# Patient Record
Sex: Female | Born: 1935 | Hispanic: Yes | Marital: Single | Smoking: Never smoker
Health system: Southern US, Community
[De-identification: ages and names within clinical notes are randomized; demographics above are authoritative.]

## PROBLEM LIST (undated history)

## (undated) DIAGNOSIS — F410 Panic disorder [episodic paroxysmal anxiety] without agoraphobia: Secondary | ICD-10-CM

## (undated) DIAGNOSIS — F0390 Unspecified dementia without behavioral disturbance: Secondary | ICD-10-CM

## (undated) DIAGNOSIS — I10 Essential (primary) hypertension: Secondary | ICD-10-CM

## (undated) DIAGNOSIS — F419 Anxiety disorder, unspecified: Secondary | ICD-10-CM

## (undated) DIAGNOSIS — I639 Cerebral infarction, unspecified: Secondary | ICD-10-CM

## (undated) HISTORY — DX: Anxiety disorder, unspecified: F41.9

## (undated) HISTORY — PX: MANDIBLE FRACTURE SURGERY: SHX706

## (undated) HISTORY — DX: Panic disorder (episodic paroxysmal anxiety): F41.0

---

## 2011-08-15 HISTORY — PX: HIP SURGERY: SHX245

## 2018-01-02 DIAGNOSIS — F0390 Unspecified dementia without behavioral disturbance: Secondary | ICD-10-CM | POA: Insufficient documentation

## 2018-03-04 DIAGNOSIS — Z8739 Personal history of other diseases of the musculoskeletal system and connective tissue: Secondary | ICD-10-CM | POA: Insufficient documentation

## 2019-03-17 ENCOUNTER — Ambulatory Visit
Admission: RE | Admit: 2019-03-17 | Discharge: 2019-03-17 | Disposition: A | Discharge status: Home / Self Care | Payer: Medicare Other | Source: Ambulatory Visit | Attending: Family Medicine | Admitting: Family Medicine

## 2019-03-17 ENCOUNTER — Other Ambulatory Visit: Payer: Self-pay | Admitting: Family Medicine

## 2019-03-17 ENCOUNTER — Ambulatory Visit
Admission: RE | Admit: 2019-03-17 | Discharge: 2019-03-17 | Disposition: A | Discharge status: Home / Self Care | Payer: Medicare Other | Attending: Family Medicine | Admitting: Family Medicine

## 2019-03-17 ENCOUNTER — Other Ambulatory Visit: Payer: Self-pay

## 2019-03-17 ENCOUNTER — Ambulatory Visit: Admission: EM | Admit: 2019-03-17 | Discharge: 2019-03-17 | Discharge status: Absent without leave | Payer: Self-pay

## 2019-03-17 ENCOUNTER — Ambulatory Visit: Admission: RE | Admit: 2019-03-17 | Payer: Medicare Other | Source: Ambulatory Visit

## 2019-03-17 DIAGNOSIS — M25561 Pain in right knee: Secondary | ICD-10-CM | POA: Insufficient documentation

## 2019-03-17 DIAGNOSIS — G8929 Other chronic pain: Secondary | ICD-10-CM

## 2021-11-15 ENCOUNTER — Emergency Department: Payer: Medicare Other

## 2021-11-15 ENCOUNTER — Emergency Department
Admission: EM | Admit: 2021-11-15 | Discharge: 2021-11-15 | Disposition: A | Discharge status: Home / Self Care | Payer: Medicare Other | Attending: Emergency Medicine | Admitting: Emergency Medicine

## 2021-11-15 ENCOUNTER — Other Ambulatory Visit: Payer: Self-pay

## 2021-11-15 ENCOUNTER — Encounter: Payer: Self-pay | Admitting: Radiology

## 2021-11-15 DIAGNOSIS — I10 Essential (primary) hypertension: Secondary | ICD-10-CM | POA: Diagnosis not present

## 2021-11-15 DIAGNOSIS — R350 Frequency of micturition: Secondary | ICD-10-CM | POA: Insufficient documentation

## 2021-11-15 DIAGNOSIS — Z20822 Contact with and (suspected) exposure to covid-19: Secondary | ICD-10-CM | POA: Diagnosis not present

## 2021-11-15 DIAGNOSIS — R0602 Shortness of breath: Secondary | ICD-10-CM

## 2021-11-15 DIAGNOSIS — F0390 Unspecified dementia without behavioral disturbance: Secondary | ICD-10-CM | POA: Insufficient documentation

## 2021-11-15 DIAGNOSIS — F419 Anxiety disorder, unspecified: Secondary | ICD-10-CM | POA: Diagnosis not present

## 2021-11-15 HISTORY — DX: Cerebral infarction, unspecified: I63.9

## 2021-11-15 HISTORY — DX: Essential (primary) hypertension: I10

## 2021-11-15 HISTORY — DX: Unspecified dementia, unspecified severity, without behavioral disturbance, psychotic disturbance, mood disturbance, and anxiety: F03.90

## 2021-11-15 LAB — CBC WITH DIFFERENTIAL/PLATELET
Abs Immature Granulocytes: 0.01 10*3/uL (ref 0.00–0.07)
Basophils Absolute: 0 10*3/uL (ref 0.0–0.1)
Basophils Relative: 0 %
Eosinophils Absolute: 0.3 10*3/uL (ref 0.0–0.5)
Eosinophils Relative: 4 %
HCT: 38.4 % (ref 36.0–46.0)
Hemoglobin: 12.6 g/dL (ref 12.0–15.0)
Immature Granulocytes: 0 %
Lymphocytes Relative: 16 %
Lymphs Abs: 1.2 10*3/uL (ref 0.7–4.0)
MCH: 29.4 pg (ref 26.0–34.0)
MCHC: 32.8 g/dL (ref 30.0–36.0)
MCV: 89.7 fL (ref 80.0–100.0)
Monocytes Absolute: 0.6 10*3/uL (ref 0.1–1.0)
Monocytes Relative: 8 %
Neutro Abs: 5.4 10*3/uL (ref 1.7–7.7)
Neutrophils Relative %: 72 %
Platelets: 225 10*3/uL (ref 150–400)
RBC: 4.28 MIL/uL (ref 3.87–5.11)
RDW: 13.3 % (ref 11.5–15.5)
WBC: 7.5 10*3/uL (ref 4.0–10.5)
nRBC: 0 % (ref 0.0–0.2)

## 2021-11-15 LAB — COMPREHENSIVE METABOLIC PANEL
ALT: 14 U/L (ref 0–44)
AST: 23 U/L (ref 15–41)
Albumin: 4.2 g/dL (ref 3.5–5.0)
Alkaline Phosphatase: 81 U/L (ref 38–126)
Anion gap: 14 (ref 5–15)
BUN: 32 mg/dL — ABNORMAL HIGH (ref 8–23)
CO2: 25 mmol/L (ref 22–32)
Calcium: 9.5 mg/dL (ref 8.9–10.3)
Chloride: 103 mmol/L (ref 98–111)
Creatinine, Ser: 0.78 mg/dL (ref 0.44–1.00)
GFR, Estimated: >60 mL/min (ref >60)
Glucose, Bld: 116 mg/dL — ABNORMAL HIGH (ref 70–99)
Potassium: 4 mmol/L (ref 3.5–5.1)
Sodium: 142 mmol/L (ref 135–145)
Total Bilirubin: 0.7 mg/dL (ref 0.3–1.2)
Total Protein: 7.3 g/dL (ref 6.5–8.1)

## 2021-11-15 LAB — URINALYSIS, ROUTINE W REFLEX MICROSCOPIC
Bilirubin Urine: NEGATIVE
Glucose, UA: NEGATIVE mg/dL
Hgb urine dipstick: NEGATIVE
Ketones, ur: NEGATIVE mg/dL
Leukocytes,Ua: NEGATIVE
Nitrite: NEGATIVE
Protein, ur: NEGATIVE mg/dL
Specific Gravity, Urine: 1.01 (ref 1.005–1.030)
pH: 9 — ABNORMAL HIGH (ref 5.0–8.0)

## 2021-11-15 LAB — BRAIN NATRIURETIC PEPTIDE: B Natriuretic Peptide: 71 pg/mL (ref 0.0–100.0)

## 2021-11-15 LAB — TROPONIN I (HIGH SENSITIVITY)
Troponin I (High Sensitivity): 7 ng/L (ref <18)
Troponin I (High Sensitivity): 8 ng/L (ref <18)

## 2021-11-15 LAB — RESP PANEL BY RT-PCR (FLU A&B, COVID) ARPGX2
Influenza A by PCR: NEGATIVE
Influenza B by PCR: NEGATIVE
SARS Coronavirus 2 by RT PCR: NEGATIVE

## 2021-11-15 MED ORDER — IOHEXOL 350 MG/ML SOLN
75.0000 mL | Freq: Once | INTRAVENOUS | Status: AC | PRN
  Administered 2021-11-15: 75 mL via INTRAVENOUS

## 2021-11-15 MED ORDER — LORAZEPAM 2 MG/ML IJ SOLN: 0.5000 mg | Freq: Once | INTRAMUSCULAR | Status: DC

## 2021-11-15 NOTE — ED Triage Notes (Signed)
Pt sister states she has been breathing this wasy for the past few days. Pt has a history of dementia but was started on Mirtazapine and thinks that it is causing her increased trouble breathing and anxiety. ?

## 2021-11-15 NOTE — ED Notes (Addendum)
Pt's daughter verbalized at bedside wants to go home, they don't want to wait for psychiatry consult.  wants to follow up w/ PCP tomorrow . ERMD made aware . ERMD at bedside and spoke to pt and family . Primary RN made aware  ?

## 2021-11-15 NOTE — Consult Note (Signed)
[facility] Face-to-Face Psychiatry Consult  ? ?Reason for Consult: Shortness of Breath  ?Referring Physician: Dr. Fuller Plan ?Patient Identification: Diana Acosta ?MRN:  [id number] ?Principal Diagnosis: <principal problem not specified> ?Diagnosis:  Active Problems: ?  Dementia (HCC) ?  Anxiety ? ? ?Total Time spent with patient: 1 hour ? ?Subjective:   ?Diana Acosta is a 88 y.o. female patient presented to [facility] ED via POV with her sister, who is her caretaker. The patient's sister shared that her sister has been breathing fast for the past few days. The patient has a history of dementia but was started on Mirtazapine and thought it was causing her to experience increased respiration and anxiety. ?This provider saw the patient face-to-face; the chart was reviewed, and consulted with Dr. Fuller Plan on 11/15/2021 due to the patient's care. It was discussed with the EDP that the patient does not meet the criteria to be admitted to the geriatric psychiatric inpatient unit.  ?On evaluation, the patient is alert and oriented x2, anxious, cooperative, and mood-congruent with affect. The patient does appear to be responding to internal and external stimuli. Neither is the patient presenting with any delusional thinking. The patient denies auditory or visual hallucinations. The patient denies any suicidal, homicidal, or self-harm ideations. The patient is not presenting with any psychotic or paranoid behaviors.  ? ?HPI: Per Dr. Fuller Plan, Diana Reamer Judye Lorino is a 88 y.o. female with stroke, dementia, HTN who comes in with SOB.  ?Couple of months saw a psychiatric nurse that gave her mirtazapine for sleep which has been helping sleep.  Since starting it she has some SOB which they report is a side effect of the medication.  There are periods were she is breathing regularly but then she has periods.  However today they noted that she seemed to be breathing harder than normally so they brought her into the emergency room to be  evaluated.  They also do report some increased urinary frequency.  No falls or hitting her head ? ?Past Psychiatric History:  ?Dementia (HCC) ?Hypertension ?Stroke [facility]) ? ?Risk to Self:   ?Risk to Others:   ?Prior Inpatient Therapy:   ?Prior Outpatient Therapy:   ? ?Past Medical History:  ?Past Medical History:  ?Diagnosis Date  ? Dementia (HCC)   ? Hypertension   ? Stroke [facility])   ?  ?Past Surgical History:  ?Procedure Laterality Date  ? MANDIBLE FRACTURE SURGERY    ? ?Family History: History reviewed. No pertinent family history. ?Family Psychiatric  History:  ?Social History:  ?Social History  ? ?Substance and Sexual Activity  ?Alcohol Use Never  ?   ?Social History  ? ?Substance and Sexual Activity  ?Drug Use Never  ?  ?Social History  ? ?Socioeconomic History  ? Marital status: Single  ?  Spouse name: Not on file  ? Number of children: Not on file  ? Years of education: Not on file  ? Highest education level: Not on file  ?Occupational History  ? Not on file  ?Tobacco Use  ? Smoking status: Never  ? Smokeless tobacco: Never  ?Vaping Use  ? Vaping Use: Never used  ?Substance and Sexual Activity  ? Alcohol use: Never  ? Drug use: Never  ? Sexual activity: Not Currently  ?Other Topics Concern  ? Not on file  ?Social History Narrative  ? Not on file  ? ?Social Determinants of Health  ? ?Financial Resource Strain: Not on file  ?Food Insecurity: Not on file  ?  Transportation Needs: Not on file  ?Physical Activity: Not on file  ?Stress: Not on file  ?Social Connections: Not on file  ? ?Additional Social History: ?  ? ?Allergies:  No Known Allergies ? ?Labs:  ?Results for orders placed or performed during the hospital encounter of 11/15/21 (from the past 48 hour(s))  ?CBC with Differential     Status: None  ? Collection Time: 11/15/21  5:45 PM  ?Result Value Ref Range  ? WBC 7.5 4.0 - 10.5 K/uL  ? RBC 4.28 3.87 - 5.11 MIL/uL  ? Hemoglobin 12.6 12.0 - 15.0 g/dL  ? HCT 38.4 36.0 - 46.0 %  ? MCV 89.7 80.0 - 100.0 fL  ? MCH  29.4 26.0 - 34.0 pg  ? MCHC 32.8 30.0 - 36.0 g/dL  ? RDW 13.3 11.5 - 15.5 %  ? Platelets 225 150 - 400 K/uL  ? nRBC 0.0 0.0 - 0.2 %  ? Neutrophils Relative % 72 %  ? Neutro Abs 5.4 1.7 - 7.7 K/uL  ? Lymphocytes Relative 16 %  ? Lymphs Abs 1.2 0.7 - 4.0 K/uL  ? Monocytes Relative 8 %  ? Monocytes Absolute 0.6 0.1 - 1.0 K/uL  ? Eosinophils Relative 4 %  ? Eosinophils Absolute 0.3 0.0 - 0.5 K/uL  ? Basophils Relative 0 %  ? Basophils Absolute 0.0 0.0 - 0.1 K/uL  ? Immature Granulocytes 0 %  ? Abs Immature Granulocytes 0.01 0.00 - 0.07 K/uL  ?  Comment: Performed at [facility], [address], [place], [place] [address]  ?Comprehensive metabolic panel     Status: Abnormal  ? Collection Time: 11/15/21  5:45 PM  ?Result Value Ref Range  ? Sodium 142 135 - 145 mmol/L  ? Potassium 4.0 3.5 - 5.1 mmol/L  ? Chloride 103 98 - 111 mmol/L  ? CO2 25 22 - 32 mmol/L  ? Glucose, Bld 116 (H) 70 - 99 mg/dL  ?  Comment: Glucose reference range applies only to samples taken after fasting for at least 8 hours.  ? BUN 32 (H) 8 - 23 mg/dL  ? Creatinine, Ser 0.78 0.44 - 1.00 mg/dL  ? Calcium 9.5 8.9 - 10.3 mg/dL  ? Total Protein 7.3 6.5 - 8.1 g/dL  ? Albumin 4.2 3.5 - 5.0 g/dL  ? AST 23 15 - 41 U/L  ? ALT 14 0 - 44 U/L  ? Alkaline Phosphatase 81 38 - 126 U/L  ? Total Bilirubin 0.7 0.3 - 1.2 mg/dL  ? GFR, Estimated >60 >60 mL/min  ?  Comment: (NOTE) ?Calculated using the CKD-EPI Creatinine Equation (2021) ?  ? Anion gap 14 5 - 15  ?  Comment: Performed at [facility], [address] [place], [place] [address]  ?Troponin I (High Sensitivity)     Status: None  ? Collection Time: 11/15/21  5:45 PM  ?Result Value Ref Range  ? Troponin I (High Sensitivity) 7 <18 ng/L  ?  Comment: (NOTE) ?Elevated high sensitivity troponin I (hsTnI) values and significant  ?changes across serial measurements may suggest ACS but many other  ?chronic and acute conditions are known to elevate hsTnI results.  ?Refer to the "Links" section for  chest pain algorithms and additional  ?guidance. ?Performed at [facility], [address] [place], ?[place] [address] ?  ?Brain natriuretic peptide     Status: None  ? Collection Time: 11/15/21  5:45 PM  ?Result Value Ref Range  ? B Natriuretic Peptide 71.0 0.0 -  100.0 pg/mL  ?  Comment: Performed at [facility], [address] [place], [place] [address]  ?Urinalysis, Routine w reflex microscopic Urine, Clean Catch     Status: Abnormal  ? Collection Time: 11/15/21  7:05 PM  ?Result Value Ref Range  ? Color, Urine STRAW (A) YELLOW  ? APPearance CLEAR (A) CLEAR  ? Specific Gravity, Urine 1.010 1.005 - 1.030  ? pH 9.0 (H) 5.0 - 8.0  ? Glucose, UA NEGATIVE NEGATIVE mg/dL  ? Hgb urine dipstick NEGATIVE NEGATIVE  ? Bilirubin Urine NEGATIVE NEGATIVE  ? Ketones, ur NEGATIVE NEGATIVE mg/dL  ? Protein, ur NEGATIVE NEGATIVE mg/dL  ? Nitrite NEGATIVE NEGATIVE  ? Leukocytes,Ua NEGATIVE NEGATIVE  ?  Comment: Performed at [facility], [address] [place], [place] [address]  ?Troponin I (High Sensitivity)     Status: None  ? Collection Time: 11/15/21  8:09 PM  ?Result Value Ref Range  ? Troponin I (High Sensitivity) 8 <18 ng/L  ?  Comment: (NOTE) ?Elevated high sensitivity troponin I (hsTnI) values and significant  ?changes across serial measurements may suggest ACS but many other  ?chronic and acute conditions are known to elevate hsTnI results.  ?Refer to the "Links" section for chest pain algorithms and additional  ?guidance. ?Performed at [facility], [address] [place], ?[place] [address] ?  ? ? ?No current facility-administered medications for this encounter.  ? ?No current outpatient medications on file.  ? ? ?Musculoskeletal: ?Strength & Muscle Tone: decreased ?Gait & Station: unsteady ?Patient leans: Backward ? ?Psychiatric Specialty Exam: ? ?Presentation  ?General Appearance: Appropriate for Environment ? ?Eye Contact:Good ? ?Speech:Garbled ? ?Speech  Volume:Decreased ? ?Handedness:Right ? ? ?Mood and Affect  ?Mood:Anxious ? ?Affect:Appropriate ? ? ?Thought Process  ?Thought Processes:Coherent ? ?Descriptions of Associations:Circumstantial ? ?Orientation:Partial ?

## 2021-11-15 NOTE — ED Provider Notes (Signed)
? ?[facility] ?Provider Note ? ? ? Event Date/Time  ? First MD Initiated Contact with Patient 11/15/21 1740   ?  (approximate) ? ? ?History  ? ?Shortness of Breath ? ? ?HPI ? ?Diana Acosta is a 88 y.o. female with stroke, dementia, HTN who comes in with SOB.  ? ?Couple of months saw a psychiatric nurse that gave her mirtazapine for sleep which has been helping sleep.  Since starting it she has some SOB which they report is a side effect of the medication.  There are periods were she is breathing regularly but then she has periods.  However today they noted that she seemed to be breathing harder than normally so they brought her into the emergency room to be evaluated.  They also do report some increased urinary frequency.  No falls or hitting her head ? ? ?Physical Exam  ? ?Triage Vital Signs: ?ED Triage Vitals  ?Enc Vitals Group  ?   BP 11/15/21 1738 (!) 159/131  ?   Pulse --   ?   Resp 11/15/21 1738 (!) 24  ?   Temp 11/15/21 1738 98.2 ?F (36.8 ?C)  ?   Temp Source 11/15/21 1738 Axillary  ?   SpO2 11/15/21 1743 97 %  ?   Weight 11/15/21 1735 109 lb (49.4 kg)  ?   Height 11/15/21 1735 5' (1.524 m)  ?   Head Circumference --   ?   Peak Flow --   ?   Pain Score --   ?   Pain Loc --   ?   Pain Edu? --   ?   Excl. in GC? --   ? ? ?Most recent vital signs: ?Vitals:  ? 11/15/21 1738 11/15/21 1743  ?BP: (!) 159/131   ?Resp: (!) 24   ?Temp: 98.2 ?F (36.8 ?C)   ?SpO2:  97%  ? ? ? ?General: Awake, no distress.  ?CV:  Good peripheral perfusion.  ?Resp:  Increased work of breathing with clear lungs ?Abd:  No distention.  ?Other:  No swelling noted ? ? ?ED Results / Procedures / Treatments  ? ?Labs ?(all labs ordered are listed, but only abnormal results are displayed) ?Labs Reviewed  ?URINE CULTURE  ?CBC WITH DIFFERENTIAL/PLATELET  ?COMPREHENSIVE METABOLIC PANEL  ?URINALYSIS, ROUTINE W REFLEX MICROSCOPIC  ?BRAIN NATRIURETIC PEPTIDE  ?TROPONIN I (HIGH SENSITIVITY)  ? ? ? ?EKG ? ?My interpretation  of EKG: ? ?Sinus tachycardia rate of 111, no ST elevations, no T wave inversions, normal intervals.  There is a lot of artifact the patient is moving around a lot ? ?RADIOLOGY ?I have reviewed the xray personally and no findings of pneumonia or pleural effusion ? ?CT pending  ? ? ?PROCEDURES: ? ?Critical Care performed: No ? ?.1-3 Lead EKG Interpretation ?Performed by: Concha Se, MD ?Authorized by: Concha Se, MD  ? ?  Interpretation: abnormal   ?  ECG rate:  110 ?  ECG rate assessment: tachycardic   ?  Rhythm: sinus tachycardia   ?  Ectopy: none   ?  Conduction: normal   ? ? ?MEDICATIONS ORDERED IN ED: ?Medications  ?iohexol (OMNIPAQUE) 350 MG/ML injection 75 mL (75 mLs Intravenous Contrast Given 11/15/21 1906)  ? ? ? ?IMPRESSION / MDM / ASSESSMENT AND PLAN / ED COURSE  ?I reviewed the triage vital signs and the nursing notes. ?             ?               ? ?  Patient comes in acutely short of breath with some increased work of breathing but her oxygen levels are actually normal at 97% and she has overall clear lungs.  There was concern from the family that this may be more anxiety.  Offered to get translator but family declined.  We will get labs to evaluate for ACS, fluid overload.  We will get chest x-ray to evaluate for any pneumonia, pneumothorax.  With family's help patient was able to calm down and upon repeat evaluation she is breathing a little bit more comfortably.\\ ? ? ?CBC normal ?CMP reassuring ?Troponin negative ?BNP negative ? ?Given patient's tachycardia with shortness of breath and elderly female will get CT scan to make sure evidence of pulmonary embolism.  If this is negative then I suspect that it could be related to her anxiety  ? ?Repeat troponin is negative ? ?CT scans were ordered I also add on a head and neck just given they have reported may be a fall in case get anticoagulate. ? ?CT scan shows old occipital infarct.  Provided a copy of result ? ?Discussed with patient's daughter  about her anxiety most likely contributing to some of her shortness of breath.  Patient is very calm and breathing normally at this time.  They requesting psychiatry consultation.  Psychiatry saw patient and cleared them and they are requesting discharge home.  Family is, asking if they can go home they want to follow-up with her outpatient psychiatrist.  Given this will discharge patient.  COVID test was also ordered and they are leaving prior to it resulting. ? ? ? ?FINAL CLINICAL IMPRESSION(S) / ED DIAGNOSES  ? ?Final diagnoses:  ?Anxiety  ?SOB (shortness of breath)  ? ? ? ?Rx / DC Orders  ? ?ED Discharge Orders   ? ? None  ? ?  ? ? ? ?Note:  This document was prepared using Dragon voice recognition software and may include unintentional dictation errors. ?  ?Concha Se, MD ?11/15/21 2232 ? ?

## 2021-11-15 NOTE — Discharge Instructions (Addendum)
There was no evidence of heart attack or blood clots and you can follow-up with your primary doctor or your psychiatrist to discuss her anxiety. ? ?Return to the ER if develops worsening shortness of breath or any other concerns.  Her COVID test is pending to follow-up in MyChart for the ? ?IMPRESSION: ?Atrophy, chronic microvascular disease. ?  ?No acute intracranial abnormality. ?  ?Old right occipital infarct. ?  ?

## 2021-11-15 NOTE — ED Notes (Signed)
Upon my arrival on shift, pt in CT ?

## 2021-11-17 LAB — URINE CULTURE

## 2022-06-20 DIAGNOSIS — F209 Schizophrenia, unspecified: Secondary | ICD-10-CM | POA: Insufficient documentation

## 2023-05-09 ENCOUNTER — Encounter: Payer: Self-pay | Admitting: Pediatrics

## 2023-05-09 ENCOUNTER — Ambulatory Visit: Payer: Medicare Other | Admitting: Pediatrics

## 2023-05-09 VITALS — Wt 84.2 lb

## 2023-05-09 DIAGNOSIS — Z8673 Personal history of transient ischemic attack (TIA), and cerebral infarction without residual deficits: Secondary | ICD-10-CM

## 2023-05-09 DIAGNOSIS — F0390 Unspecified dementia without behavioral disturbance: Secondary | ICD-10-CM | POA: Diagnosis not present

## 2023-05-09 DIAGNOSIS — Z681 Body mass index (BMI) 19 or less, adult: Secondary | ICD-10-CM | POA: Insufficient documentation

## 2023-05-09 DIAGNOSIS — I10 Essential (primary) hypertension: Secondary | ICD-10-CM | POA: Insufficient documentation

## 2023-05-09 DIAGNOSIS — E46 Unspecified protein-calorie malnutrition: Secondary | ICD-10-CM | POA: Diagnosis not present

## 2023-05-09 DIAGNOSIS — Z7689 Persons encountering health services in other specified circumstances: Secondary | ICD-10-CM

## 2023-05-09 DIAGNOSIS — F202 Catatonic schizophrenia: Secondary | ICD-10-CM | POA: Diagnosis not present

## 2023-05-09 DIAGNOSIS — F203 Undifferentiated schizophrenia: Secondary | ICD-10-CM

## 2023-05-09 DIAGNOSIS — R54 Age-related physical debility: Secondary | ICD-10-CM | POA: Insufficient documentation

## 2023-05-09 MED ORDER — RISPERIDONE 0.25 MG PO TABS: 0.2500 mg | ORAL_TABLET | Freq: Every day | ORAL | 11 refills | Status: DC

## 2023-05-09 MED ORDER — MIRTAZAPINE 7.5 MG PO TABS
7.5000 mg | ORAL_TABLET | Freq: Every day | ORAL | 3 refills | Status: DC
Start: 2023-05-09 — End: 2023-05-18

## 2023-05-09 MED ORDER — SERTRALINE HCL 25 MG PO TABS
12.5000 mg | ORAL_TABLET | Freq: Every day | ORAL | 11 refills | Status: DC
Start: 2023-05-09 — End: 2023-05-18

## 2023-05-09 NOTE — Assessment & Plan Note (Addendum)
Progressively declining. Treatment for behavioral disturbances as noted under schizophrenia problem.

## 2023-05-09 NOTE — Assessment & Plan Note (Signed)
Start protein supplements. Consider nutrition referral at follow up.

## 2023-05-09 NOTE — Assessment & Plan Note (Addendum)
On chart review, unclear when she was diagnosed with this. Unable to find psychiatry notes. She has been diagnosed w dementia w behavioral disturbance which suspect may have contributed to prior episodes of psychosis. She is currently on risperidone 0.25mg  as needed and sertraline 12.5mg . Pt's sister, who is primary caregiver, noted she seemed less anxious and more at ease while or mirtazapine. Plan to restart this and continue the sertraline. Continue risperidone at current dose, consider rexulti vs dose adjustments for behavioral disturbances.

## 2023-05-09 NOTE — Assessment & Plan Note (Signed)
Noted today. High risk for falls. She does not do well with home aids. Will work on nutrition and education around fall risk for family/caregivers.

## 2023-05-09 NOTE — Progress Notes (Signed)
Establish Care Note  Wt 84 lb 3.2 oz (38.2 kg)   BMI 16.44 kg/m    Subjective:    Patient ID: Diana Acosta, female    DOB: 1929-03-18, 89 y.o.   MRN: [id number]  HPI: Diana Acosta is a 89 y.o. female  Chief Complaint  Patient presents with   Establish Care    Unable to obtain vitals due to patient unable remain still, only weight   Due to language barrier, spanish speaking provider was present during the history-taking and subsequent discussion (and for part of the physical exam) with this patient.   Niece and sister at bedside help provide history.  Behavior Disturbance Pt w h/o schizophrenia and dementia She recently had some medication changes. Discontinued mirtazapine due to abnormal movement/inability to sit still which sister though may have been from medication. Was started on sertraline and has not worked to help her be more calm Family interested in going back on mirtazapine  Weight loss Thought to be secondary to inability to sit still, activity>caloric intake Thickening food, using Whey protein All meals pureed No dentures  Relevant past medical, surgical, family and social history reviewed and updated as indicated. Interim medical history since our last visit reviewed. Allergies and medications reviewed and updated.  ROS per HPI unless specifically indicated above     Objective:    Wt 84 lb 3.2 oz (38.2 kg)   BMI 16.44 kg/m   Wt Readings from Last 3 Encounters:  05/09/23 84 lb 3.2 oz (38.2 kg)  11/15/21 109 lb (49.4 kg)     Physical Exam Constitutional:      Appearance: Normal appearance.     Comments: Elderly, frail, thin-appearing  HENT:     Head: Normocephalic and atraumatic.  Eyes:     Pupils: Pupils are equal, round, and reactive to light.  Cardiovascular:     Rate and Rhythm: Normal rate and regular rhythm.     Pulses: Normal pulses.     Heart sounds: Normal heart sounds.  Pulmonary:     Effort: Pulmonary effort is  normal.     Breath sounds: Normal breath sounds.  Abdominal:     General: Abdomen is flat.     Palpations: Abdomen is soft.  Musculoskeletal:        General: Normal range of motion.     Cervical back: Normal range of motion.  Skin:    General: Skin is warm and dry.     Capillary Refill: Capillary refill takes less than 2 seconds.  Neurological:     Mental Status: She is alert. Mental status is at baseline.     Comments: Unable to do full exam due to akathisia No focal deficits noted Shuffled gait  Psychiatric:     Comments: Patient nonverbal, notably had orofacial dyskinesia as well as akthisia (unable to sit during entire visit)        Assessment & Plan:  Assessment & Plan   Dementia, unspecified dementia severity, unspecified dementia type, unspecified whether behavioral, psychotic, or mood disturbance or anxiety (HCC) Assessment & Plan: Progressively declining. Treatment for behavioral disturbances as noted under schizophrenia problem.   Orders: -     Mirtazapine; Take 1 tablet (7.5 mg total) by mouth at bedtime.  Dispense: 90 tablet; Refill: 3 -     risperiDONE; Take 1 tablet (0.25 mg total) by mouth daily.  Dispense: 30 tablet; Refill: 11 -     Sertraline HCl; Take 0.5 tablets (12.5 mg total)  by mouth daily.  Dispense: 15 tablet; Refill: 11  Schizophrenia, catatonic (HCC) Assessment & Plan: On chart review, unclear when she was diagnosed with this. Unable to find psychiatry notes. She has been diagnosed w dementia w behavioral disturbance which suspect may have contributed to prior episodes of psychosis. She is currently on risperidone 0.25mg  as needed and sertraline 12.5mg . Pt's sister, who is primary caregiver, noted she seemed less anxious and more at ease while or mirtazapine. Plan to restart this and continue the sertraline. Continue risperidone at current dose, consider rexulti vs dose adjustments for behavioral disturbances.   Orders: -     Mirtazapine; Take 1 tablet  (7.5 mg total) by mouth at bedtime.  Dispense: 90 tablet; Refill: 3 -     risperiDONE; Take 1 tablet (0.25 mg total) by mouth daily.  Dispense: 30 tablet; Refill: 11 -     Sertraline HCl; Take 0.5 tablets (12.5 mg total) by mouth daily.  Dispense: 15 tablet; Refill: 11  Senile dementia (HCC) Assessment & Plan: Progressively declining. Treatment for behavioral disturbances as noted under schizophrenia problem.    Undifferentiated schizophrenia (HCC) Assessment & Plan: On chart review, unclear when she was diagnosed with this. Unable to find psychiatry notes. She has been diagnosed w dementia w behavioral disturbance which suspect may have contributed to prior episodes of psychosis. She is currently on risperidone 0.25mg  as needed and sertraline 12.5mg . Pt's sister, who is primary caregiver, noted she seemed less anxious and more at ease while or mirtazapine. Plan to restart this and continue the sertraline. Continue risperidone at current dose, consider rexulti vs dose adjustments for behavioral disturbances.    Protein-calorie malnutrition, unspecified severity (HCC) Assessment & Plan: Patient with ongoing weight loss presumably because she is moving/burning so many calories during the day. Discussed adding protein shake supplements at least daily to start. Family purees all food and they report decent appetite. Consider nutrition referral for further support at follow up.   Frailty Assessment & Plan: Noted today. High risk for falls. She does not do well with home aids. Will work on nutrition and education around fall risk for family/caregivers.   H/O: CVA (cerebrovascular accident) Assessment & Plan: Previously on plavix no longer taking. Continue to monitor.   Body mass index (BMI) less than 16.5 Assessment & Plan: Start protein supplements. Consider nutrition referral at follow up.   Encounter to establish care Reviewed patient record including history, medications, problem  list. Will schedule for virtual follow up to continue adjusting medications.   Follow up plan: Return in about 1 week (around 05/16/2023) for (virtual), Mood.  Yanely Mast Howell Pringle, MD

## 2023-05-09 NOTE — Assessment & Plan Note (Signed)
Patient with ongoing weight loss presumably because she is moving/burning so many calories during the day. Discussed adding protein shake supplements at least daily to start. Family purees all food and they report decent appetite. Consider nutrition referral for further support at follow up.

## 2023-05-09 NOTE — Patient Instructions (Signed)
[id number] a [employer]!  Como su mdico de atencin primaria, espero trabajar con usted para ayudarle a Barista sus objetivos de [employer].  Tenga en cuenta un par de elementos logsticos: - Si me enva un mensaje a Clinical cytogeneticist, es posible que tarde [place] 1 y 2 das hbiles en responderle. Esto es para mensajes no urgentes.  - Si necesita atencin clnica urgente, llame a la clnica o presntese en la sala de urgencias/emergencias. - Si tiene laboratorios, Recruitment consultant un mensaje sobre ellos en 1 o 2 das hbiles. - No estoy aqu los lunes; de lo contrario, estar disponible de martes a viernes de 8 a.m. a 5 p.m.

## 2023-05-09 NOTE — Assessment & Plan Note (Signed)
Previously on plavix no longer taking. Continue to monitor.

## 2023-05-18 ENCOUNTER — Telehealth (INDEPENDENT_AMBULATORY_CARE_PROVIDER_SITE_OTHER): Payer: Medicare Other | Admitting: Pediatrics

## 2023-05-18 ENCOUNTER — Encounter: Payer: Self-pay | Admitting: Pediatrics

## 2023-05-18 VITALS — Temp 97.6°F

## 2023-05-18 DIAGNOSIS — F203 Undifferentiated schizophrenia: Secondary | ICD-10-CM | POA: Diagnosis not present

## 2023-05-18 DIAGNOSIS — F0390 Unspecified dementia without behavioral disturbance: Secondary | ICD-10-CM

## 2023-05-18 DIAGNOSIS — F03911 Unspecified dementia, unspecified severity, with agitation: Secondary | ICD-10-CM | POA: Diagnosis not present

## 2023-05-18 DIAGNOSIS — F202 Catatonic schizophrenia: Secondary | ICD-10-CM

## 2023-05-18 NOTE — Progress Notes (Unsigned)
   Telehealth Visit  I connected with  Diana Acosta on 05/22/23 by a video enabled telemedicine application and verified that I am speaking with the correct person using two identifiers.   I discussed the limitations of evaluation and management by telemedicine. The patient expressed understanding and agreed to proceed.  Due to language barrier, verified spanish speaking provider was present during the history-taking and subsequent discussion (and for part of the physical exam) with this patient.  Subjective:    Patient ID: Diana Acosta, female    DOB: 1929/08/08, 89 y.o.   MRN: [id number]  HPI: Diana Acosta is a 89 y.o. female  Chief Complaint  Patient presents with   Dementia    Pt sister states pt has been nice and calm    Sister helped provide the bulk of the history Has noticed improvement on current regimen, she only gives sertraline prn; feels sertraline makes her too sleepy Sister would not want both medications- sertraline and mirtazapine Risperidal 0.25mg  prn, every 2 days  Relevant past medical, surgical, family and social history reviewed and updated as indicated. Interim medical history since our last visit reviewed. Allergies and medications reviewed and updated.  ROS per HPI unless specifically indicated above     Objective:    Temp 97.6 F (36.4 C) (Oral) Comment: Patient obtained  Wt Readings from Last 3 Encounters:  05/09/23 84 lb 3.2 oz (38.2 kg)  11/15/21 109 lb (49.4 kg)     Physical Exam Constitutional:      General: She is not in acute distress.    Appearance: Normal appearance.  Neurological:     General: No focal deficit present.     Mental Status: She is alert. Mental status is at baseline.     LIMITED EXAM GIVEN VIDEO VISIT     Assessment & Plan:  Assessment & Plan   Undifferentiated schizophrenia (HCC) Dementia with agitation, unspecified dementia severity, unspecified dementia type (HCC) Assessment &  Plan: Improved on current regimen. Plan to dc sertraline, increase dose of mirtazapine. Consider rexulti if ongoing concerns for agitation.  Orders: -     Mirtazapine; Take 1 tablet (15 mg total) by mouth at bedtime.  Dispense: 30 tablet; Refill: 6  Follow up plan: Return in about 2 weeks (around 06/01/2023) for Mood.  Isabellarose Kope P Othel Dicostanzo, MD   This visit was completed via video visit through MyChart due to the restrictions of the COVID-19 pandemic. All issues as above were discussed and addressed. Physical exam was done as above through visual confirmation on video through MyChart. If it was felt that the patient should be evaluated in the office, they were directed there. The patient verbally consented to this visit."} Location of the patient: home Location of the provider: work Those involved with this call:  Provider: Modena Nunnery, MD CMA:  Maggie Font, CMA Time spent on call:  10 minutes with patient face to face via video conference. More than 50% of this time was spent in counseling and coordination of care. 20 minutes total spent in review of patient's record and preparation of their chart.

## 2023-05-18 NOTE — Patient Instructions (Signed)
[email or web address]

## 2023-05-19 IMAGING — CT CT HEAD W/O CM
4 series · 16 of 47 positions shown, 18 images · non-contrast
Comparison: None.

CLINICAL DATA: Head trauma, minor (Age >= 65y)



[Series 2: head wo · axial · 0.43mm/px · z∈[-104,+16]mm · 7 of 33 slices shown, 9 images]
[im 5/33  brain]
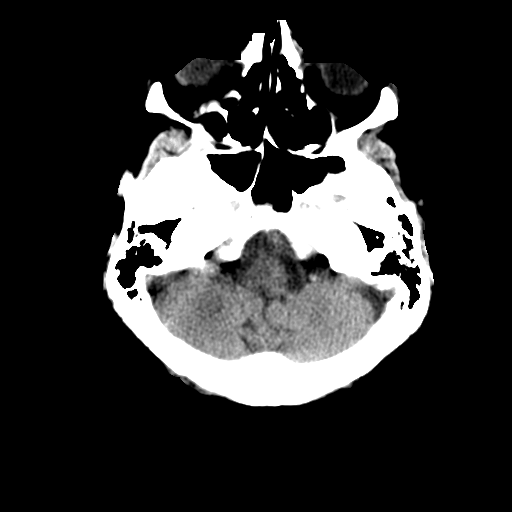
[im 5/33  bone]
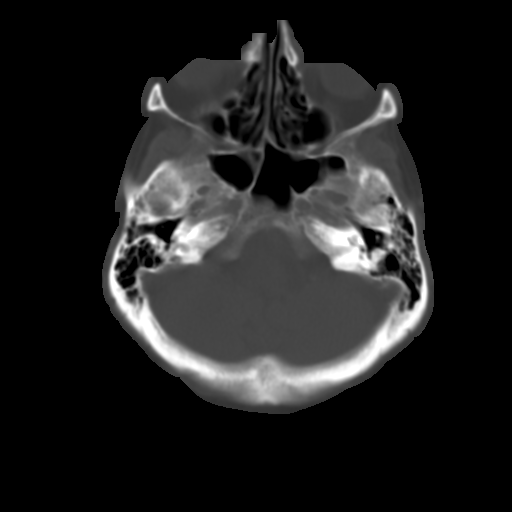
[im 9/33  brain]
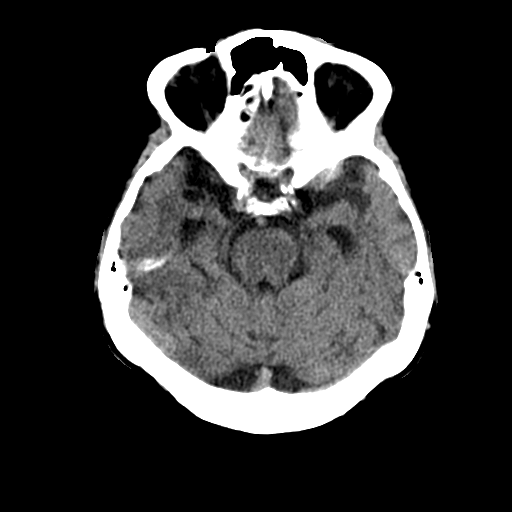
[im 13/33  brain]
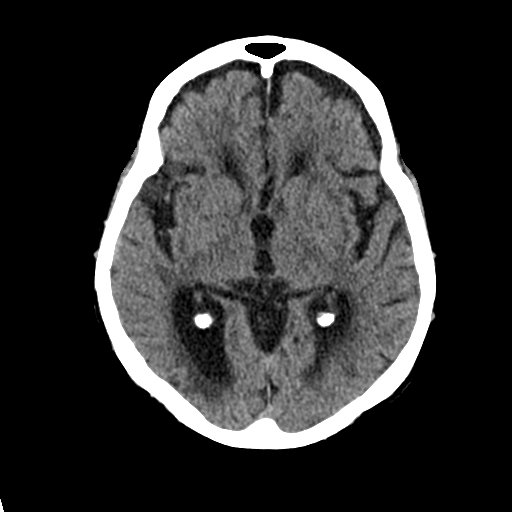
[im 17/33  brain]
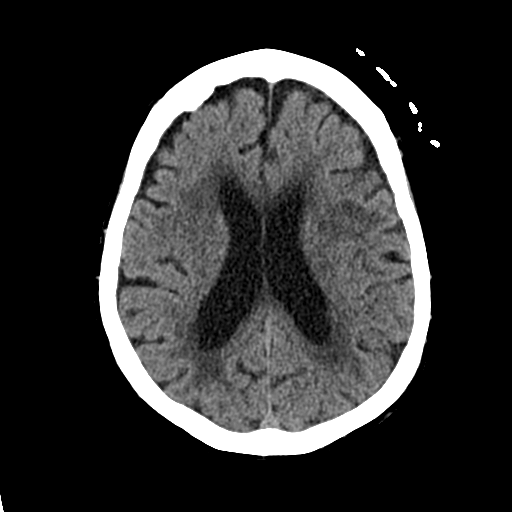
[im 21/33  brain]
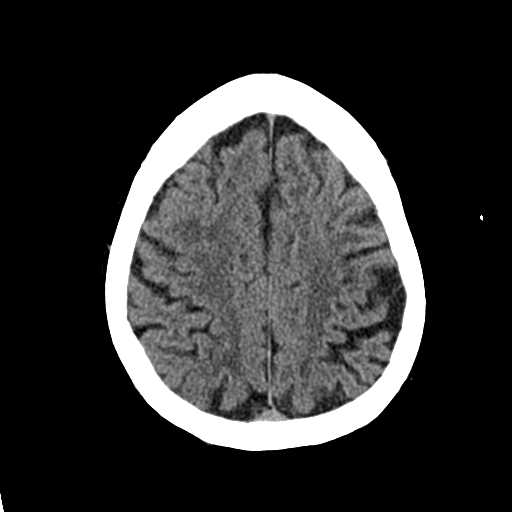
[im 21/33  bone]
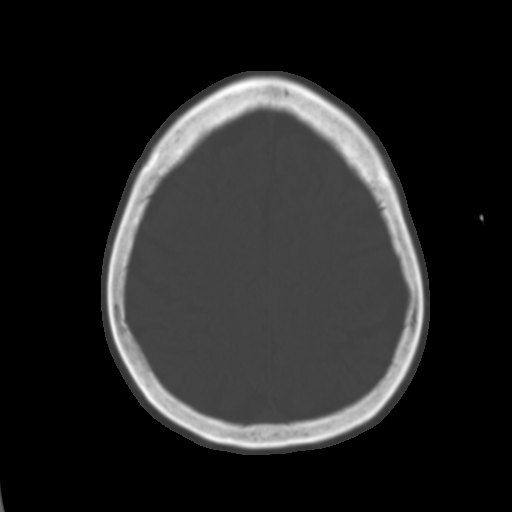
[im 25/33  brain]
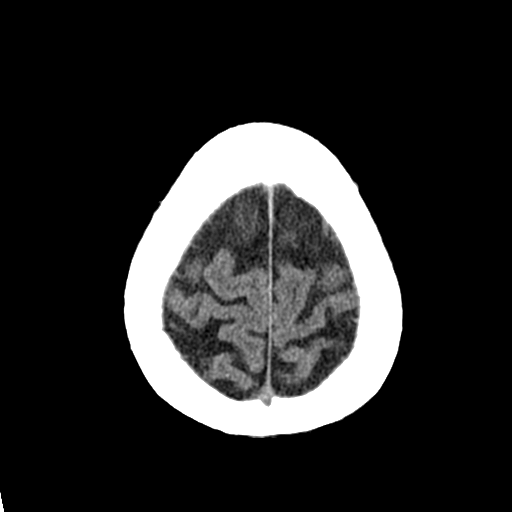
[im 29/33  brain]
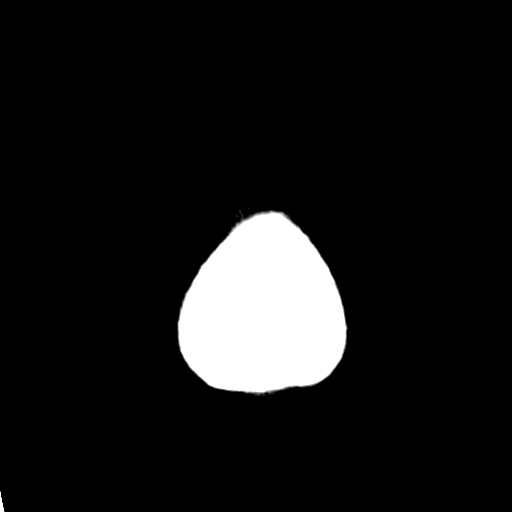

[Series 3: head bone · axial · 0.43mm/px · z∈[-108,-76]mm · 3 of 81 slices shown]
[im 9/81  bone]
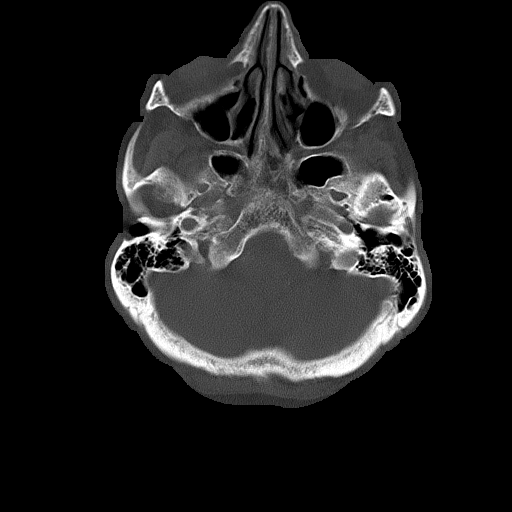
[im 17/81  bone]
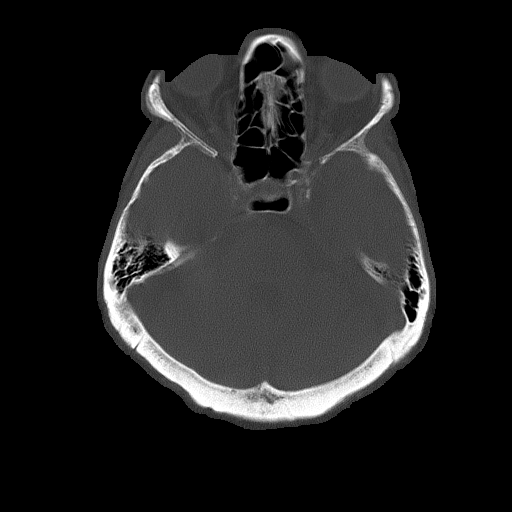
[im 25/81  bone]
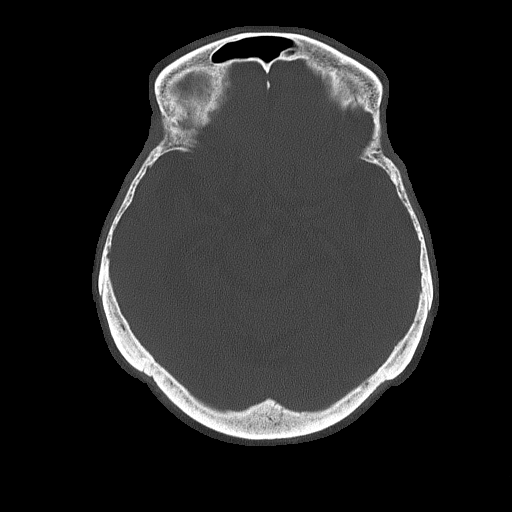

[Series 4: coronal soft tissue · coronal · 0.31mm/px · 3 of 63 slices shown]
[im 21/63  brain]
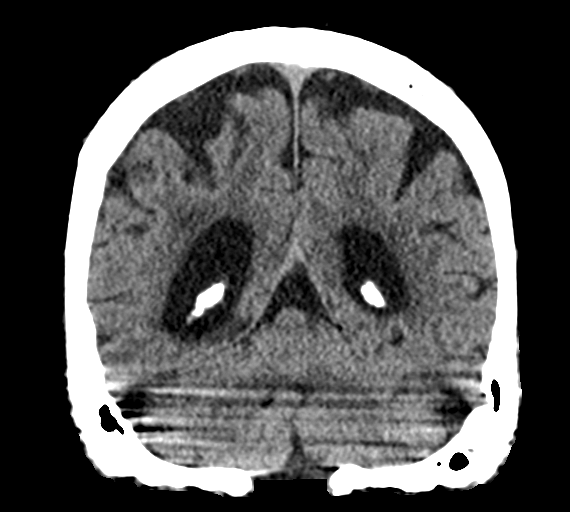
[im 28/63  brain]
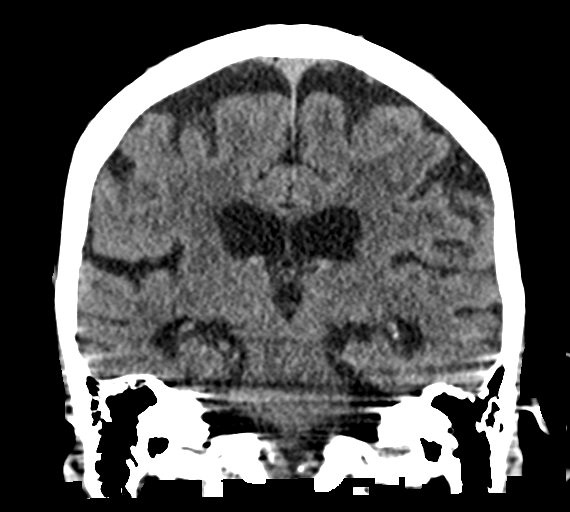
[im 35/63  brain]
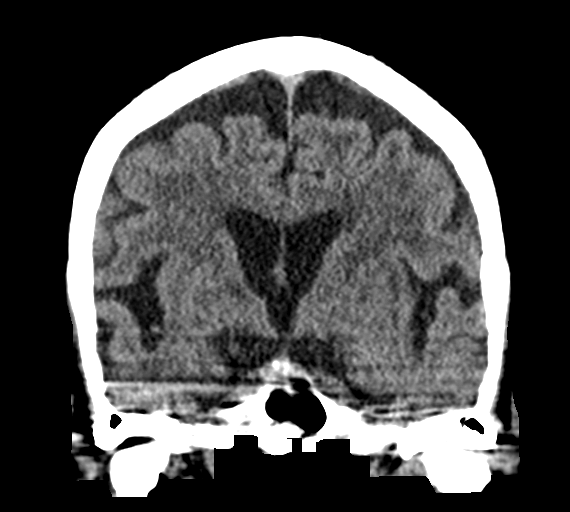

[Series 5: sagittal soft tissue · sagittal · 0.30mm/px · 3 of 53 slices shown]
[im 18/53  brain]
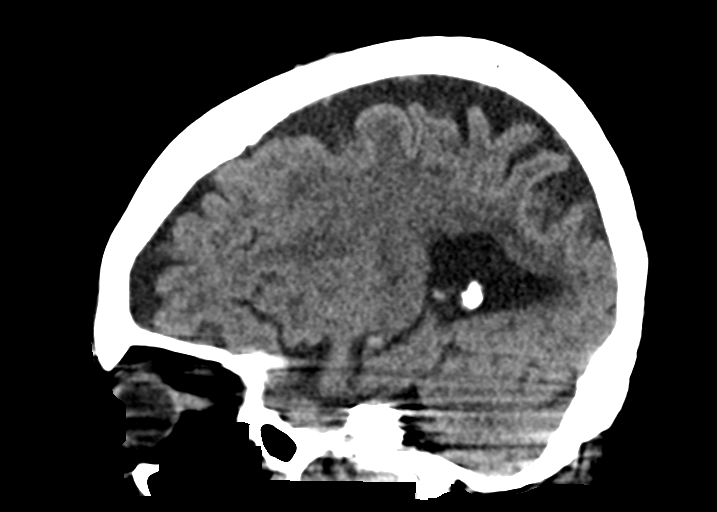
[im 27/53  brain]
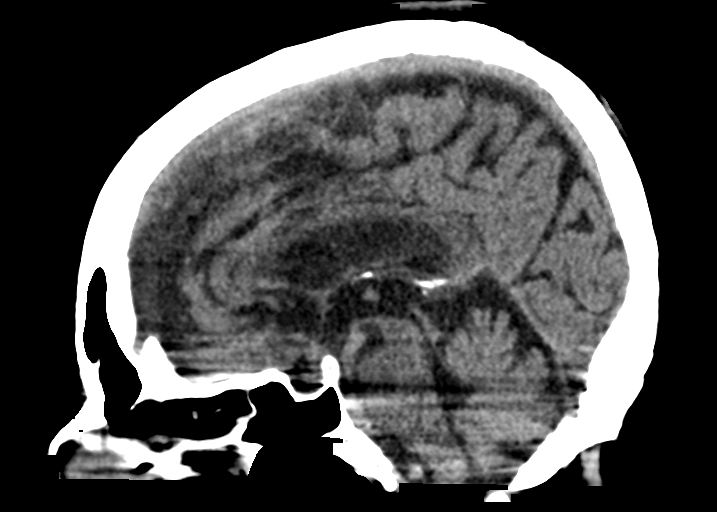
[im 35/53  brain]
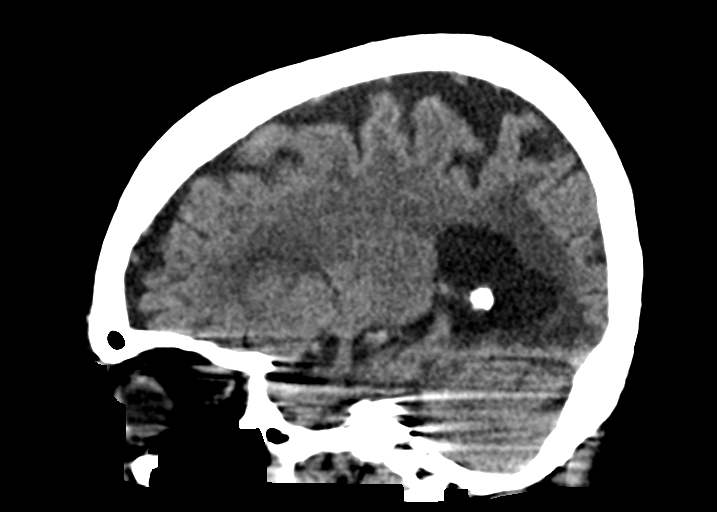

[16 of 47 positions shown; findings below may reference images not displayed]

FINDINGS: Brain: There is atrophy and chronic small vessel disease changes.
Old right occipital infarct. No acute intracranial abnormality.
Specifically, no hemorrhage, hydrocephalus, mass lesion, acute
infarction, or significant intracranial injury.

Vascular: No hyperdense vessel or unexpected calcification.

Skull: No acute calvarial abnormality.

Sinuses/Orbits: No acute findings

Other: None
IMPRESSION: Atrophy, chronic microvascular disease.

No acute intracranial abnormality.

Old right occipital infarct.

## 2023-05-19 IMAGING — CT CT CERVICAL SPINE W/O CM
4 of 8 series · 12 of 35 positions shown, 13 images · non-contrast
Comparison: None.

CLINICAL DATA: Neck trauma (Age >= 65y).



[Series 5: coronal bone · coronal · 0.22mm/px · 2 of 50 slices shown]
[im 6/50  bone]
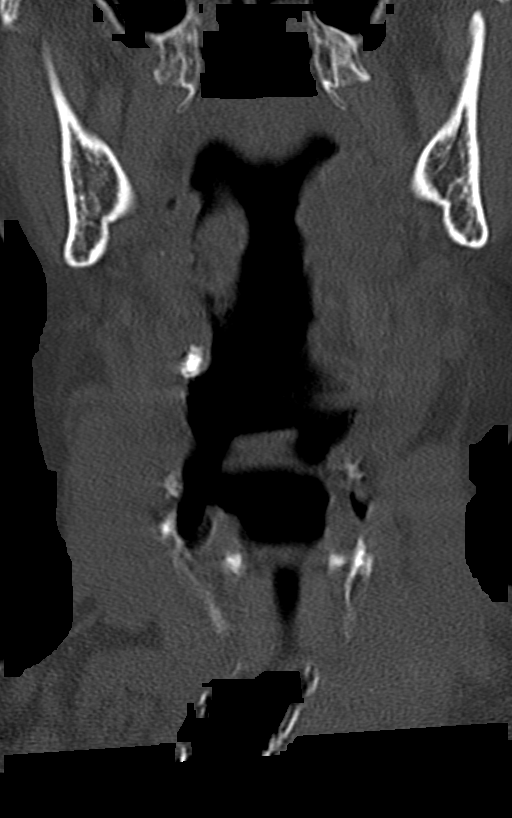
[im 28/50  bone]
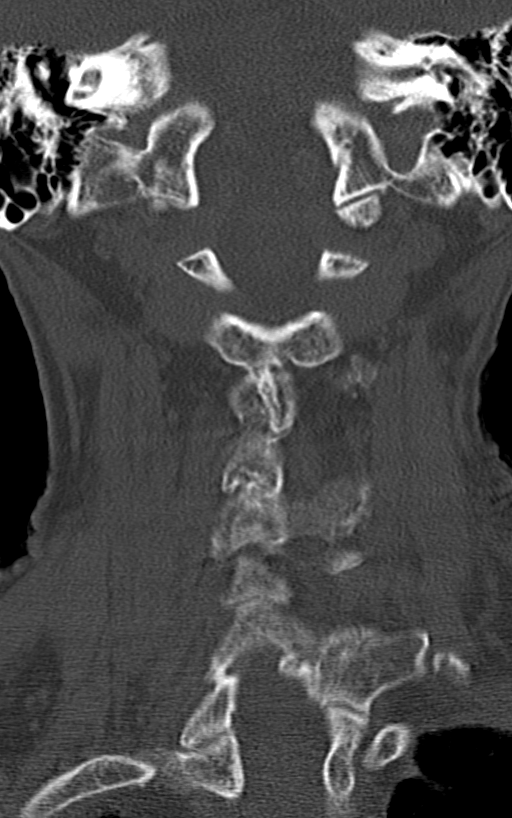

[Series 6: orthogonal bone · axial · 0.22mm/px · z∈[-235,-184]mm · 2 of 91 slices shown, 3 images (1 of 2)]
[im 31/91  soft-tissue]
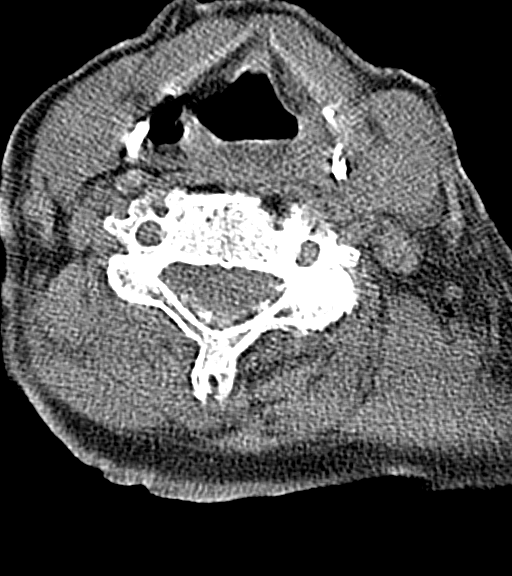
[im 31/91  bone]
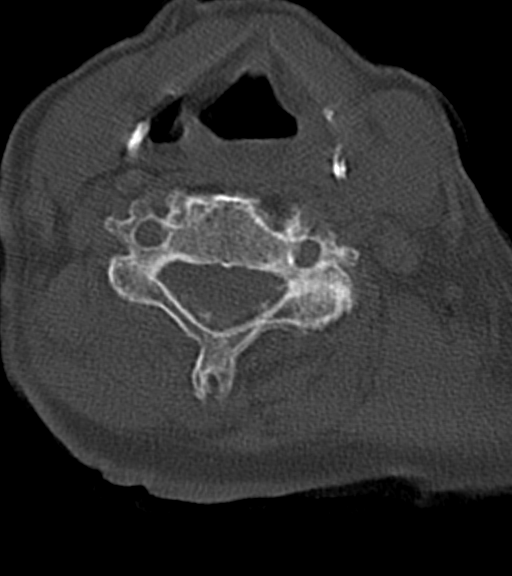
[im 61/91  bone]
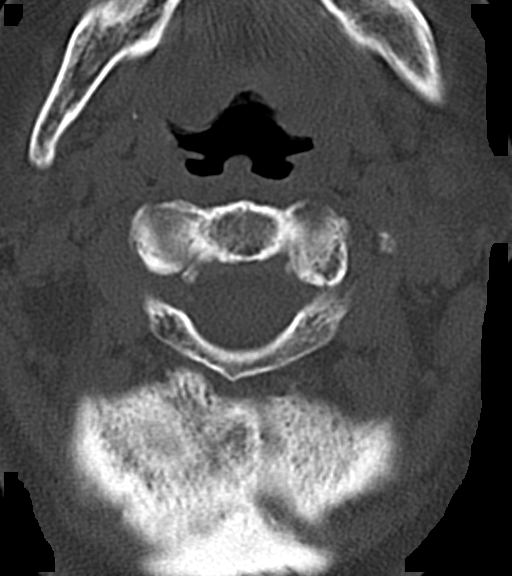

[Series 9: sagittal bone · sagittal · 0.26mm/px · 6 of 57 slices shown]
[im 10/57  bone]
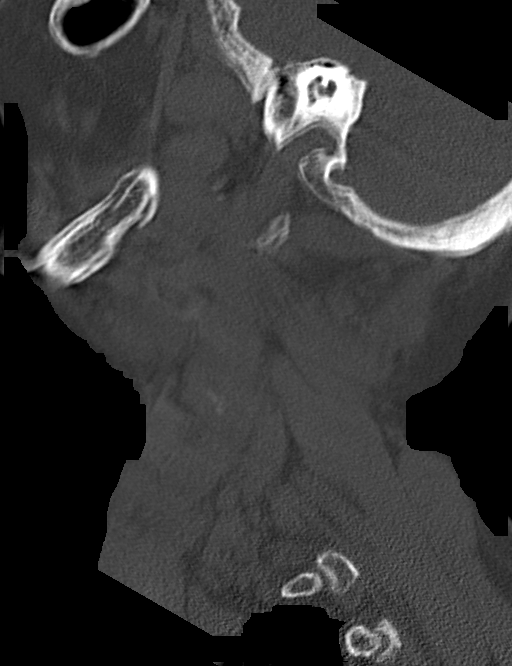
[im 19/57  bone]
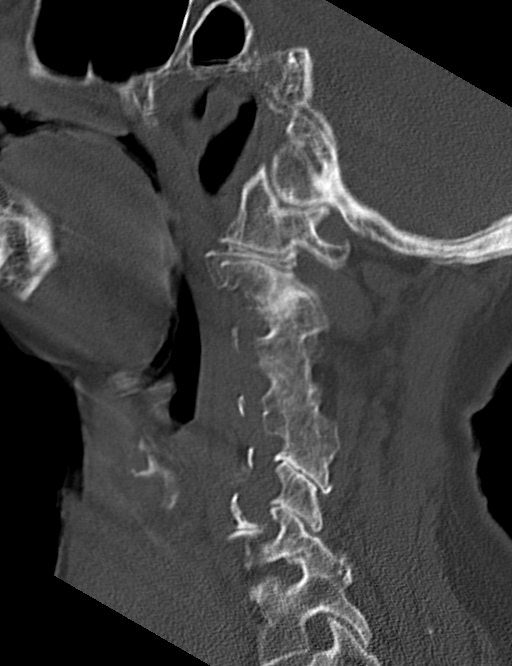
[im 29/57  bone]
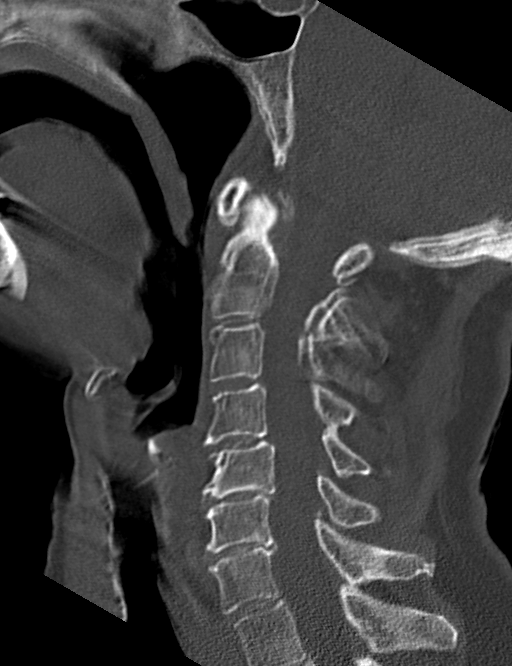
[im 33/57  soft-tissue]
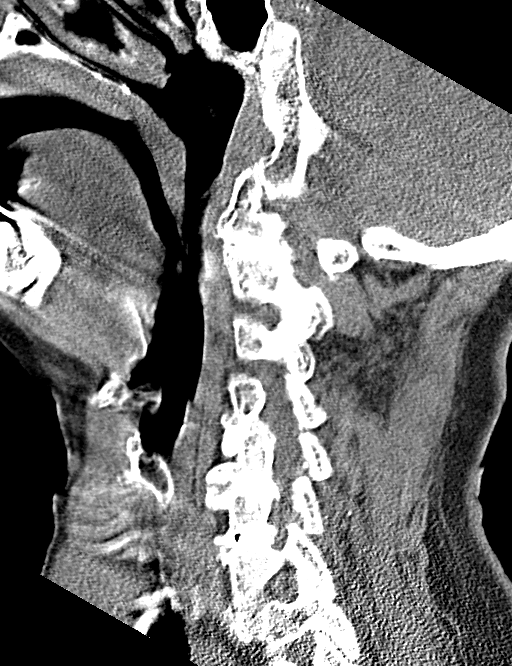
[im 38/57  bone]
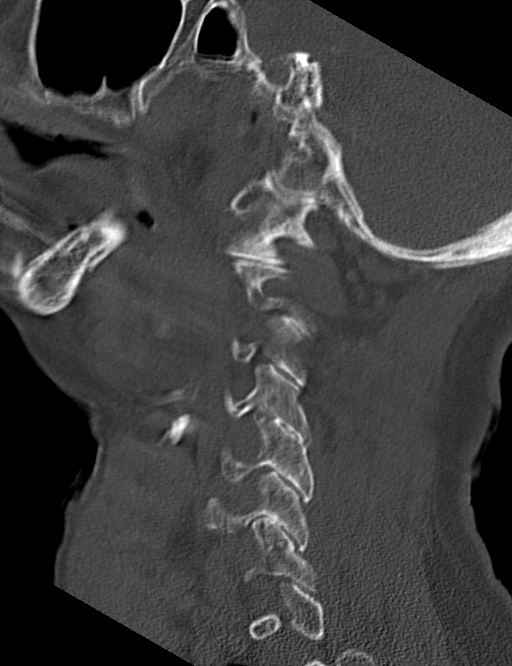
[im 47/57  bone]
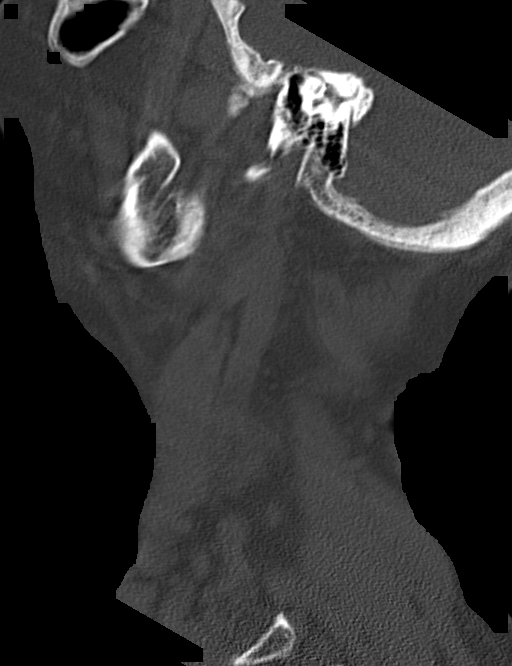

[Series 11: orthogonal bone · axial · 0.24mm/px · z∈[-236,-180]mm · 2 of 94 slices shown (2 of 2)]
[im 32/94  bone]
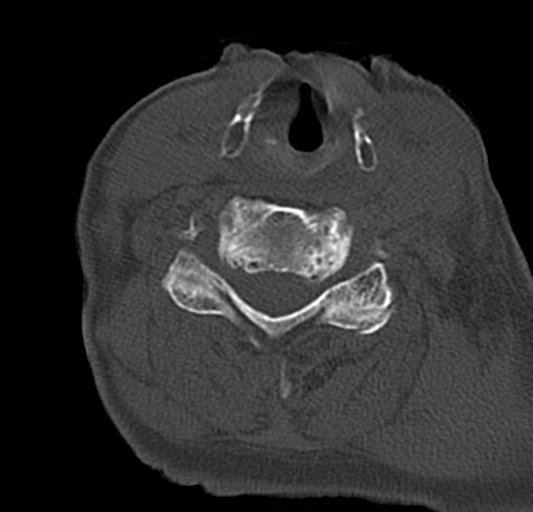
[im 63/94  bone]
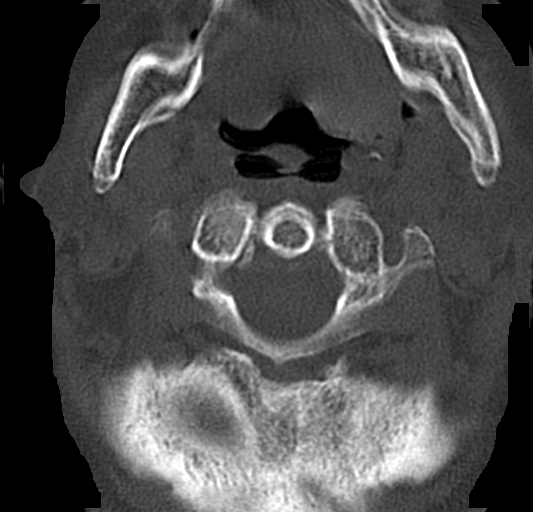

[12 of 35 positions shown; findings below may reference images not displayed]

FINDINGS: Alignment: No subluxation.

Skull base and vertebrae: No acute fracture. No primary bone lesion
or focal pathologic process.

Soft tissues and spinal canal: No prevertebral fluid or swelling. No
visible canal hematoma.

Disc levels: Diffuse moderate to advanced degenerative disc disease,
most pronounced at C5-6 and C6-7. Advanced bilateral degenerative
facet disease diffusely.

Upper chest: Biapical scarring no acute findings

Other: Nodular thyroid.  See chest CT report for further discussion.
IMPRESSION: No acute bony abnormality.  Degenerative disc and facet disease.

## 2023-05-22 ENCOUNTER — Encounter: Payer: Self-pay | Admitting: Pediatrics

## 2023-05-22 MED ORDER — MIRTAZAPINE 15 MG PO TBDP
15.0000 mg | ORAL_TABLET | Freq: Every day | ORAL | 6 refills | Status: DC
Start: 2023-05-22 — End: 2023-06-27

## 2023-05-22 NOTE — Assessment & Plan Note (Addendum)
Improved on current regimen. Plan to dc sertraline, increase dose of mirtazapine. Consider rexulti if ongoing concerns for agitation.

## 2023-05-29 ENCOUNTER — Telehealth (INDEPENDENT_AMBULATORY_CARE_PROVIDER_SITE_OTHER): Payer: Medicare Other | Admitting: Pediatrics

## 2023-05-29 ENCOUNTER — Encounter: Payer: Self-pay | Admitting: Pediatrics

## 2023-05-29 DIAGNOSIS — Z9181 History of falling: Secondary | ICD-10-CM | POA: Diagnosis not present

## 2023-05-29 DIAGNOSIS — F203 Undifferentiated schizophrenia: Secondary | ICD-10-CM

## 2023-05-29 DIAGNOSIS — F03911 Unspecified dementia, unspecified severity, with agitation: Secondary | ICD-10-CM

## 2023-05-29 NOTE — Patient Instructions (Signed)
Continuar el plan, nos vemos en 4 semanas

## 2023-05-29 NOTE — Assessment & Plan Note (Signed)
Remeron 15mg  going well. Minimal agitation. Eating better. Continue to monitor.

## 2023-05-29 NOTE — Progress Notes (Signed)
Telehealth Visit  I connected with  Diana Acosta on 05/29/23 by a video enabled telemedicine application and verified that I am speaking with the correct person using two identifiers.   I discussed the limitations of evaluation and management by telemedicine. The patient expressed understanding and agreed to proceed.  Subjective:    Patient ID: Diana Acosta, female    DOB: 05-18-1929, 89 y.o.   MRN: [id number]  HPI: Diana Acosta is a 89 y.o. female  Chief Complaint  Patient presents with   Dementia    Checking on mood new doses of medications is doing really good    Fall    Pt fell out of bed last night but doesn't believe she got hurt    Due to language barrier, spanish speaking provider was present during the history-taking and subsequent discussion (and for part of the physical exam) with this patient.  Caregiver, sister provided history.  #Dementia Recent changes have been going well, first few nights had some issues but now normalized Has been improving  She did have some night time agitation, but this is improving Has been using risperal at night time as needed which has helped Has been eating more, supplement, swallowing issues not as bad  #fall Had fall last night Was sitting on mattress and slowly fell out of bed No head trauma, did not notice any bruising or appeared to be in pain Did not seek medical attention  Relevant past medical, surgical, family and social history reviewed and updated as indicated. Interim medical history since our last visit reviewed. Allergies and medications reviewed and updated.  ROS per HPI unless specifically indicated above     Objective:    There were no vitals taken for this visit.  Wt Readings from Last 3 Encounters:  05/09/23 84 lb 3.2 oz (38.2 kg)  11/15/21 109 lb (49.4 kg)     Physical Exam Constitutional:      General: She is not in acute distress.    Appearance: Normal appearance.   Neurological:     General: No focal deficit present.     Mental Status: She is alert. Mental status is at baseline.     LIMITED EXAM GIVEN VIDEO VISIT     Assessment & Plan:  Assessment & Plan   Dementia with agitation, unspecified dementia severity, unspecified dementia type (HCC) Assessment & Plan: Remeron 15mg  going well. Minimal agitation. Eating better. Continue to monitor.   Undifferentiated schizophrenia (HCC) Assessment & Plan: Continue risperidal. Sibling (caregiver) is satisfied with current regimen.    History of recent fall Witnessed mechanical due to mattress issue. No head trauma. No concerns today. Will replace the mattress. Continue to monitor.  Follow up plan: Return in about 4 weeks (around 06/26/2023) for (virtual), behavior f/u.  Dustie Brittle P Lanina Larranaga, MD   This visit was completed via video visit through MyChart due to the restrictions of the COVID-19 pandemic. All issues as above were discussed and addressed. Physical exam was done as above through visual confirmation on video through MyChart. If it was felt that the patient should be evaluated in the office, they were directed there. The patient verbally consented to this visit."} Location of the patient: home Location of the provider: work Those involved with this call:  Provider: Modena Nunnery, MD CMA:  Maggie Font, CMA Time spent on call:  10 minutes with patient face to face via video conference. More than 50% of this time was spent in counseling  and coordination of care. 20 minutes total spent in review of patient's record and preparation of their chart.

## 2023-05-29 NOTE — Assessment & Plan Note (Signed)
Continue risperidal. Sibling (caregiver) is satisfied with current regimen.

## 2023-06-05 ENCOUNTER — Telehealth: Payer: Medicare Other | Admitting: Pediatrics

## 2023-06-05 ENCOUNTER — Telehealth: Payer: Self-pay | Admitting: Pediatrics

## 2023-06-05 ENCOUNTER — Encounter: Payer: Self-pay | Admitting: Pediatrics

## 2023-06-05 NOTE — Telephone Encounter (Signed)
Patient has been informed.

## 2023-06-05 NOTE — Telephone Encounter (Signed)
Called patient to cancel and reschedule appt care giver stated that the listed medication was changed  from 7.5 to 15 mg and thinks that the dose is not working. She stated that the patient is down during the day and waking up early very anxious. She would like to continue giving the 7.5 dose. Please advise. Current Outpatient Medications on File Prior to Visit    Dispense Refill                                       mirtazapine (REMERON SOL-TAB) 15 MG disintegrating tablet Take 1 tablet (15 mg total) by mouth at bedtime. 30 tablet 6

## 2023-06-06 NOTE — Progress Notes (Signed)
This encounter was created in error - please disregard.

## 2023-06-27 ENCOUNTER — Telehealth (INDEPENDENT_AMBULATORY_CARE_PROVIDER_SITE_OTHER): Payer: Medicare Other | Admitting: Pediatrics

## 2023-06-27 DIAGNOSIS — F03911 Unspecified dementia, unspecified severity, with agitation: Secondary | ICD-10-CM

## 2023-06-27 DIAGNOSIS — F203 Undifferentiated schizophrenia: Secondary | ICD-10-CM | POA: Diagnosis not present

## 2023-06-27 MED ORDER — REXULTI 0.5 MG PO TABS
0.5000 mg | ORAL_TABLET | Freq: Every day | ORAL | 0 refills | Status: DC
Start: 2023-06-27 — End: 2023-07-18

## 2023-06-27 MED ORDER — MIRTAZAPINE 15 MG PO TBDP: 7.5000 mg | ORAL_TABLET | Freq: Every day | ORAL | 6 refills | Status: DC

## 2023-06-27 NOTE — Patient Instructions (Addendum)
Plan: - nuevo medicamento: rexulti 0.5mg  todos los dias a la misma hora - continuar mirtazapine 7.5mg , la pastilla que es desintegrante se puede partir por mitad - descontinuar risperidone

## 2023-06-27 NOTE — Progress Notes (Signed)
Telehealth Visit  I connected with  Diana Acosta on 07/03/23 by a video enabled telemedicine application and verified that I am speaking with the correct person using two identifiers.   I discussed the limitations of evaluation and management by telemedicine. The patient expressed understanding and agreed to proceed.  Subjective:    Patient ID: Diana Acosta, female    DOB: 14-Sep-1928, 89 y.o.   MRN: [id number]  HPI: Diana Acosta is a 89 y.o. female  Chief Complaint  Patient presents with   Dementia    4 week f/up   Due to language barrier, spanish speaking provider was present during the history-taking and subsequent discussion (and for part of the physical exam) with this patient.  Discussed the use of AI scribe software for clinical note transcription with the patient, who gave verbal consent to proceed.  History of Present Illness   The patient, with a history of dementia, has been experiencing increased agitation and restlessness over the past three weeks. She has been particularly restless at night, with three nights of complete insomnia. The patient's restlessness is characterized by a persistent desire to leave, which has been managed with daily doses of Risperidone 0.25mg . However, the calming effect of the medication only lasts for about two hours before the restlessness returns. On one occasion, the patient required up to 1mg  of Risperidone in a day to manage her symptoms.  The patient's family has noted that the patient appears fearful and anxious, often expressing a fear of being taken away. They have also observed that the patient's restlessness increases in the evenings. Despite these challenges, the patient has had periods of lucidity, such as reciting the rosary from memory.  The patient is also on Mirtazapine 7.5mg , which has been effective in stimulating her appetite. However, there was a period when the dose was increased to 15mg , which  seemed to overstimulate the patient's mind and exacerbate her restlessness.  The patient's family has a history of similar neurological conditions, with four sisters and their mother having experienced similar symptoms. The family has been managing the patient's condition with a combination of medication and supportive care.     Relevant past medical, surgical, family and social history reviewed and updated as indicated. Interim medical history since our last visit reviewed. Allergies and medications reviewed and updated.  ROS per HPI unless specifically indicated above     Objective:    There were no vitals taken for this visit.  Wt Readings from Last 3 Encounters:  05/09/23 84 lb 3.2 oz (38.2 kg)  11/15/21 109 lb (49.4 kg)     Physical Exam Constitutional:      General: She is not in acute distress.    Appearance: Normal appearance.  Neurological:     General: No focal deficit present.     Mental Status: She is alert. Mental status is at baseline.     LIMITED EXAM GIVEN VIDEO VISIT     Assessment & Plan:  Assessment & Plan   Dementia with agitation, unspecified dementia severity, unspecified dementia type (HCC) Undifferentiated schizophrenia (HCC) Increased agitation and restlessness, with periods of fear and anxiety. Patient has been on Risperidone 0.25mg  as needed, with some benefit, but the effect is short-lived. Continue Mirtazapine 7.5mg  nightly. Will stop risperidone and start rexulti.  -Start a new medication for agitation, starting at the lowest dose, to be taken daily. -Continue Mirtazapine 7.5mg  nightly. -Limit Risperidone use to a maximum of 0.25mg  once daily. -  Check in one week to assess response to the new medication. If the patient is not doing well, contact the doctor sooner. -     Mirtazapine; Take 0.5 tablets (7.5 mg total) by mouth at bedtime.  Dispense: 15 tablet; Refill: 6 -     Rexulti; Take 1 tablet (0.5 mg total) by mouth daily.  Dispense: 30 tablet;  Refill: 0   Follow up plan: Return in about 1 week (around 07/04/2023) for mood/behavior.  Shamera Yarberry P Rexford Prevo, MD   This visit was completed via video visit through MyChart due to the restrictions of the COVID-19 pandemic. All issues as above were discussed and addressed. Physical exam was done as above through visual confirmation on video through MyChart. If it was felt that the patient should be evaluated in the office, they were directed there. The patient verbally consented to this visit."} Location of the patient: home Location of the provider: work Those involved with this call:  Provider: Modena Nunnery, MD CMA: Wilhemena Durie, CMA Time spent on call:  10 minutes on the phone discussing health concerns. 20 minutes total spent in review of patient's record and preparation of their chart.

## 2023-07-03 ENCOUNTER — Encounter: Payer: Self-pay | Admitting: Pediatrics

## 2023-07-04 NOTE — Progress Notes (Signed)
Scheduled appointment on 07/18/2023 @ 2:20 pm.

## 2023-07-18 ENCOUNTER — Ambulatory Visit (INDEPENDENT_AMBULATORY_CARE_PROVIDER_SITE_OTHER): Payer: Medicare Other | Admitting: Pediatrics

## 2023-07-18 DIAGNOSIS — F03911 Unspecified dementia, unspecified severity, with agitation: Secondary | ICD-10-CM | POA: Diagnosis not present

## 2023-07-18 MED ORDER — BREXPIPRAZOLE 1 MG PO TABS
1.0000 mg | ORAL_TABLET | Freq: Every day | ORAL | 0 refills | Status: DC
Start: 2023-07-18 — End: 2023-07-31

## 2023-07-18 NOTE — Progress Notes (Unsigned)
Office Visit  Wt 84 lb (38.1 kg)   BMI 16.41 kg/m  Unable to tolerate vitals.   Subjective:    Patient ID: Diana Acosta, female    DOB: 1928/10/21, 89 y.o.   MRN: [id number]  HPI: Diana Acosta is a 89 y.o. female  Chief Complaint  Patient presents with   Follow-up    Discussed the use of AI scribe software for clinical note transcription with the patient, who gave verbal consent to proceed.  History of Present Illness   The patient, with a history of dementia, has been experiencing increased agitation and insomnia. The patient's caregiver reports that the first two days after starting mirtazapine were particularly challenging, with the patient becoming highly agitated and experiencing complete loss of sleep. The patient was reportedly restless, insisting on leaving the house, and was unable to sleep until the early hours of the morning.  In the following days, the patient appeared calmer, but required constant walking. The caregiver noted a decrease in the patient's stamina, with the patient becoming tired and experiencing leg pain after only five minutes of walking, a significant decrease from her previous 20-minute walks.  The caregiver also reported a monthly cycle in the patient's disease progression, where the patient would become particularly unwell regardless of medication. During these periods, the patient's agitation was previously managed with Risperdal. However, since starting mirtazapine, the caregiver has not administered Risperdal, resulting in two particularly challenging days where the patient was awake with open eyes, unable to sleep until the early hours of the morning.  Despite these challenges, the caregiver has noticed some improvements with the new medication. The patient appears calmer and is able to sit for longer periods. However, the patient's legs have been hurting, and she appears more tired. The caregiver also reported that the patient has  been talking in her sleep, mentioning family names and cooking, which was not observed when the patient was taking Risperdal.  The patient's appetite has improved, and she has maintained her weight. The caregiver has been administering the new medication in the morning, with mirtazapine given in the evening. The caregiver has also been giving the patient melatonin to aid sleep.      Relevant past medical, surgical, family and social history reviewed and updated as indicated. Interim medical history since our last visit reviewed. Allergies and medications reviewed and updated.  ROS per HPI unless specifically indicated above     Objective:    Wt 84 lb (38.1 kg)   BMI 16.41 kg/m   Wt Readings from Last 3 Encounters:  07/19/23 84 lb (38.1 kg)  05/09/23 84 lb 3.2 oz (38.2 kg)  11/15/21 109 lb (49.4 kg)     Physical Exam Constitutional:      Appearance: Normal appearance.  HENT:     Head: Normocephalic and atraumatic.  Eyes:     Pupils: Pupils are equal, round, and reactive to light.  Cardiovascular:     Rate and Rhythm: Normal rate and regular rhythm.     Pulses: Normal pulses.     Heart sounds: Normal heart sounds.  Pulmonary:     Effort: Pulmonary effort is normal.     Breath sounds: Normal breath sounds.  Abdominal:     General: Abdomen is flat.     Palpations: Abdomen is soft.  Musculoskeletal:        General: Normal range of motion.     Cervical back: Normal range of motion.  Skin:  General: Skin is warm and dry.     Capillary Refill: Capillary refill takes less than 2 seconds.  Neurological:     General: No focal deficit present.     Mental Status: She is alert. Mental status is at baseline.     Comments: Unable to test rigidity to to [place]  Psychiatric:        Mood and Affect: Mood normal.        Behavior: Behavior is cooperative.     Comments: Significantly improved impulsivity, able to sit during visit. Able to communicate a few words during visit.  Akathisia present intermittently. Oral dyskinesia noted on prior exam resolved.         05/09/2023   10:06 AM  Depression screen PHQ 2/9  Decreased Interest 2  Down, Depressed, Hopeless 1  PHQ - 2 Score 3  Altered sleeping 3  Tired, decreased energy 0  Change in appetite 0  Feeling bad or failure about yourself  0  Trouble concentrating 2  Moving slowly or fidgety/restless 3  Suicidal thoughts 0  PHQ-9 Score 11  Difficult doing work/chores Somewhat difficult       05/09/2023   10:07 AM  GAD 7 : Generalized Anxiety Score  Nervous, Anxious, on Edge 3  Control/stop worrying 3  Worry too much - different things 3  Trouble relaxing 3  Restless 3  Easily annoyed or irritable 0  Afraid - awful might happen 3  Total GAD 7 Score 18  Anxiety Difficulty Somewhat difficult       Assessment & Plan:  Assessment & Plan   Dementia with agitation, unspecified dementia severity, unspecified dementia type (HCC) Assessment & Plan: Initial worsening of agitation after starting Mirtazapine 7.5mg  at night and Rexulti 0.5mg  in the morning. Agitation improved over the next few days. Noted monthly cycle of increased agitation. No use of Risperdal during this period. Patient also reported increased fatigue and leg pain with walking. Visibly improved today on exam. -Increase Rexulti to 1mg  in the morning. -Continue Mirtazapine 7.5mg  at night. -Consider use of Risperdal for severe agitation episodes. -Consider adding Melatonin for sleep, dose can be increased from current 2.5mg . -Follow-up in one week via video visit to assess response to increased Rexulti dose.  Orders: -     Brexpiprazole; Take 1 tablet (1 mg total) by mouth daily.  Dispense: 30 tablet; Refill: 0    Follow up plan: Return in about 1 week (around 07/25/2023) for (virtual).  Arvo Ealy Howell Pringle, MD

## 2023-07-18 NOTE — Patient Instructions (Signed)
Subir a 1mg  de rexulti

## 2023-07-19 NOTE — Assessment & Plan Note (Signed)
Initial worsening of agitation after starting Mirtazapine 7.5mg  at night and Rexulti 0.5mg  in the morning. Agitation improved over the next few days. Noted monthly cycle of increased agitation. No use of Risperdal during this period. Patient also reported increased fatigue and leg pain with walking. Visibly improved today on exam. -Increase Rexulti to 1mg  in the morning. -Continue Mirtazapine 7.5mg  at night. -Consider use of Risperdal for severe agitation episodes. -Consider adding Melatonin for sleep, dose can be increased from current 2.5mg . -Follow-up in one week via video visit to assess response to increased Rexulti dose.

## 2023-07-25 ENCOUNTER — Telehealth: Payer: Medicare Other | Admitting: Pediatrics

## 2023-07-31 ENCOUNTER — Telehealth (INDEPENDENT_AMBULATORY_CARE_PROVIDER_SITE_OTHER): Payer: Medicare Other | Admitting: Pediatrics

## 2023-07-31 ENCOUNTER — Encounter: Payer: Self-pay | Admitting: Pediatrics

## 2023-07-31 VITALS — Ht 60.0 in | Wt 92.0 lb

## 2023-07-31 DIAGNOSIS — F203 Undifferentiated schizophrenia: Secondary | ICD-10-CM

## 2023-07-31 DIAGNOSIS — F03911 Unspecified dementia, unspecified severity, with agitation: Secondary | ICD-10-CM

## 2023-07-31 MED ORDER — BREXPIPRAZOLE 0.25 MG PO TABS
0.2500 mg | ORAL_TABLET | Freq: Every day | ORAL | 0 refills | Status: DC
Start: 2023-07-31 — End: 2023-08-10

## 2023-07-31 MED ORDER — TRAZODONE HCL 50 MG PO TABS
25.0000 mg | ORAL_TABLET | Freq: Every evening | ORAL | 3 refills | Status: DC | PRN
Start: 2023-07-31 — End: 2023-08-28

## 2023-07-31 NOTE — Patient Instructions (Addendum)
Rexulti 0.25mg  diario, no usen el risperidol con esta medicina, es mejor usar trazadone durante el dia si esta agitada Mirtazapine 7.5mg  (15mg  por mitad) Trazadone 25-50mg  para dormir

## 2023-07-31 NOTE — Assessment & Plan Note (Signed)
Patient's caregiver reports increased wakefulness and agitation with Rexulti 0.5mg , unable to tolerate 1mg . Improvement noted with reduction to 0.25mg  and addition of Risperdal, but concerns about excessive sedation and potential drug interaction. Emphasized need to take rexulti daily and if taking, cannot take risperidal. Given ongoing agitation and insomnia concerns, will trial trazadone. -Continue Rexulti at 0.25mg  daily. -Discontinue Risperdal. -Add Trazodone 25-50mg  for sleep and daytime agitation. -Continue Mirtazapine 7.5mg  at bedtime. -Consider referral to geriatric psychiatry specialist if no improvement with current changes.

## 2023-07-31 NOTE — Progress Notes (Signed)
Telehealth Visit  I connected with  Diana Acosta on 07/31/23 by a video enabled telemedicine application and verified that I am speaking with the correct person using two identifiers.   I discussed the limitations of evaluation and management by telemedicine. The patient expressed understanding and agreed to proceed.  Subjective:    Patient ID: Diana Acosta, female    DOB: June 30, 1929, 89 y.o.   MRN: [id number]  HPI: Diana Acosta is a 89 y.o. female  Chief Complaint  Patient presents with   Follow-up   Due to language barrier, spanish speaking provider was present during the history-taking and subsequent discussion (and for part of the physical exam) with this patient.  Discussed the use of AI scribe software for clinical note transcription with the patient, who gave verbal consent to proceed.  History of Present Illness   The patient, with a history of mental health concerns, has been experiencing increased agitation and insomnia. The patient's caregiver reports that the patient becomes excessively alert and experiences racing thoughts, leading to a lack of sleep. The patient's mind is described as working "24 hours without stopping." The patient's sleep deprivation has been so severe that it has led to constant talking and an inability to rest or sit down.  The patient was previously on a 0.5mg  dose of Rexulti, which was reduced to 0.25mg  due to the severity of the symptoms. Despite the reduction, the patient continued to experience sleeplessness and excessive alertness. The caregiver attempted to manage the patient's symptoms with Risperdal, which helped to some extent but led to the patient sleeping almost all day.  The patient is also on Mirtazapine, which the caregiver continues to administer. However, the caregiver expressed concerns about potential interactions between the patient's medications. The patient has a history of trying various medications  for her condition, but none have been successful. The caregiver expressed frustration with previous healthcare providers who would add new medications without discontinuing the old ones, leading to the patient's system being "overloaded" with medications.  The patient also experiences vivid dreams and nightmares, which contribute to her sleep disturbances. The caregiver reports that the patient seems tired but is unable to rest or sit down. The patient's agitation is described as being like a machine that talks and talks and wants to do things. The caregiver is concerned about the patient's lack of sleep and the impact it has on her overall health and wellbeing.      Relevant past medical, surgical, family and social history reviewed and updated as indicated. Interim medical history since our last visit reviewed. Allergies and medications reviewed and updated.  ROS per HPI unless specifically indicated above     Objective:    Ht 5' (1.524 m)   Wt 92 lb (41.7 kg)   BMI 17.97 kg/m   Wt Readings from Last 3 Encounters:  07/31/23 92 lb (41.7 kg)  07/19/23 84 lb (38.1 kg)  05/09/23 84 lb 3.2 oz (38.2 kg)     Physical Exam Constitutional:      General: She is not in acute distress.    Appearance: Normal appearance.     Comments: Unable to sit still, at baseline  Neurological:     General: No focal deficit present.     Mental Status: She is alert. Mental status is at baseline.    LIMITED EXAM GIVEN VIDEO VISIT     Assessment & Plan:  Assessment & Plan   Dementia with agitation, unspecified  dementia severity, unspecified dementia type (HCC) Undifferentiated schizophrenia (HCC) Assessment & Plan: Patient's caregiver reports increased wakefulness and agitation with Rexulti 0.5mg , unable to tolerate 1mg . Improvement noted with reduction to 0.25mg  and addition of Risperdal, but concerns about excessive sedation and potential drug interaction. Emphasized need to take rexulti daily and if  taking, cannot take risperidal. Given ongoing agitation and insomnia concerns, will trial trazadone. -Continue Rexulti at 0.25mg  daily. -Discontinue Risperdal. -Add Trazodone 25-50mg  for sleep and daytime agitation. -Continue Mirtazapine 7.5mg  at bedtime. -Consider referral to geriatric psychiatry specialist if no improvement with current changes.   Orders: -     traZODone HCl; Take 0.5-1 tablets (25-50 mg total) by mouth at bedtime as needed for sleep.  Dispense: 30 tablet; Refill: 3 -     Brexpiprazole; Take 1 tablet (0.25 mg total) by mouth daily.  Dispense: 30 tablet; Refill: 0  Follow up plan: Return in about 10 days (around 08/10/2023).  Tyland Klemens P Jakyron Fabro, MD   This visit was completed via video visit through MyChart due to the restrictions of the COVID-19 pandemic. All issues as above were discussed and addressed. Physical exam was done as above through visual confirmation on video through MyChart. If it was felt that the patient should be evaluated in the office, they were directed there. The patient verbally consented to this visit."} Location of the patient: home Location of the provider: work Those involved with this call:  Provider: Modena Nunnery, MD CMA: Oswaldo Conroy, CMA Time spent on call:  15 minutes on the phone discussing health concerns. 15 minutes total spent in review of patient's record and preparation of their chart. Total time spent on this encounter: 30 minutes.

## 2023-08-10 ENCOUNTER — Telehealth (INDEPENDENT_AMBULATORY_CARE_PROVIDER_SITE_OTHER): Payer: Medicare Other | Admitting: Pediatrics

## 2023-08-10 ENCOUNTER — Encounter: Payer: Self-pay | Admitting: Pediatrics

## 2023-08-10 DIAGNOSIS — F203 Undifferentiated schizophrenia: Secondary | ICD-10-CM | POA: Diagnosis not present

## 2023-08-10 DIAGNOSIS — F03911 Unspecified dementia, unspecified severity, with agitation: Secondary | ICD-10-CM | POA: Diagnosis not present

## 2023-08-10 MED ORDER — MIRTAZAPINE 15 MG PO TBDP
7.5000 mg | ORAL_TABLET | Freq: Every day | ORAL | 11 refills | Status: DC
Start: 2023-08-10 — End: 2023-11-06

## 2023-08-10 MED ORDER — BREXPIPRAZOLE 0.25 MG PO TABS
0.2500 mg | ORAL_TABLET | Freq: Every day | ORAL | 2 refills | Status: DC
Start: 2023-08-10 — End: 2023-10-25

## 2023-08-10 NOTE — Patient Instructions (Signed)
[employer]uar [employer]

## 2023-08-10 NOTE — Assessment & Plan Note (Signed)
Managed with Rexulti 0.25mg  daily. No need for Risperdal. -Continue Rexulti 0.25mg  daily. -Use Trazodone 25-50mg  as needed for agitation during the day. -Avoid Risperdal while on Rexulti.

## 2023-08-10 NOTE — Progress Notes (Signed)
Telehealth Visit  I connected with  Diana Acosta on 08/10/23 by a video enabled telemedicine application and verified that I am speaking with the correct person using two identifiers.   I discussed the limitations of evaluation and management by telemedicine. The patient expressed understanding and agreed to proceed.  Subjective:    Patient ID: Diana Acosta, female    DOB: November 17, 1928, 89 y.o.   MRN: [id number]  HPI: Diana Acosta is a 89 y.o. female  Chief Complaint  Patient presents with   Follow-up   Due to language barrier, spanish speaking provider was present during the history-taking and subsequent discussion (and for part of the physical exam) with this patient.   Discussed the use of AI scribe software for clinical note transcription with the patient, who gave verbal consent to proceed.  History of Present Illness   The patient, diagnosed with a psychiatric condition, has been experiencing sleep disturbances and agitation. She recently started on Trazodone, initially at half the dose, which improved her sleep for the first two nights. However, by the third night, she began experiencing intermittent awakenings, though not as severe as before.  During the day, the patient appears calm and slightly drowsy, which is considered preferable to her previous agitated state. She is also on Rexulti 0.25mg , and there has been no need for Risperdal. The patient's mobility has decreased due to her relaxed state, requiring assistance for walking.  The patient has not experienced any episodes of agitation that would necessitate the use of Risperdal. The patient's medication regimen also includes Mirtazapine, which is split in half for administration.  Overall, the patient's condition has improved with the current medication regimen, particularly her sleep pattern and agitation. The patient's caregiver is satisfied with the current state of the patient's health.       Relevant past medical, surgical, family and social history reviewed and updated as indicated. Interim medical history since our last visit reviewed. Allergies and medications reviewed and updated.  ROS per HPI unless specifically indicated above     Objective:    There were no vitals taken for this visit.  Wt Readings from Last 3 Encounters:  07/31/23 92 lb (41.7 kg)  07/19/23 84 lb (38.1 kg)  05/09/23 84 lb 3.2 oz (38.2 kg)     Physical Exam Constitutional:      General: She is not in acute distress.    Appearance: Normal appearance.  Neurological:     General: No focal deficit present.     Mental Status: She is alert. Mental status is at baseline.      LIMITED EXAM GIVEN VIDEO VISIT     Assessment & Plan:  Assessment & Plan   Dementia with agitation, unspecified dementia severity, unspecified dementia type (HCC) Assessment & Plan: Improved sleep with Trazodone 25-50mg  at bedtime. Noted some daytime drowsiness. -Continue Trazodone 25-50mg  at bedtime. -Consider dose adjustment if daytime drowsiness persists.  Orders: -     Brexpiprazole; Take 1 tablet (0.25 mg total) by mouth daily.  Dispense: 30 tablet; Refill: 2  Undifferentiated schizophrenia (HCC) Assessment & Plan: Managed with Rexulti 0.25mg  daily. No need for Risperdal. -Continue Rexulti 0.25mg  daily. -Use Trazodone 25-50mg  as needed for agitation during the day. -Avoid Risperdal while on Rexulti.  Orders: -     Mirtazapine; Take 0.5 tablets (7.5 mg total) by mouth at bedtime.  Dispense: 15 tablet; Refill: 11     Follow up plan: Return in about 3 weeks (around  08/31/2023) for Mood.  Samera Macy P Marnie Fazzino, MD   This visit was completed via video visit through MyChart due to the restrictions of the COVID-19 pandemic. All issues as above were discussed and addressed. Physical exam was done as above through visual confirmation on video through MyChart. If it was felt that the patient should be evaluated in the  office, they were directed there. The patient verbally consented to this visit."} Location of the patient: home Location of the provider: work Those involved with this call:  Provider: Modena Nunnery, MD CMA: Babs Bertin, CMA Time spent on call:  10 minutes with patient face to face via video conference. More than 50% of this time was spent in counseling and coordination of care. 10 minutes total spent in review of patient's record and preparation of their chart. Total time spent on this encounter: 20 minutes.

## 2023-08-10 NOTE — Assessment & Plan Note (Addendum)
Improved sleep with Trazodone 25-50mg  at bedtime. Noted some daytime drowsiness. -Continue Trazodone 25-50mg  at bedtime. -Consider dose adjustment if daytime drowsiness persists. -Continue Mirtazapine 7.5mg  at bedtime.

## 2023-08-28 ENCOUNTER — Encounter: Payer: Self-pay | Admitting: Pediatrics

## 2023-08-28 ENCOUNTER — Telehealth (INDEPENDENT_AMBULATORY_CARE_PROVIDER_SITE_OTHER): Payer: Medicare Other | Admitting: Pediatrics

## 2023-08-28 DIAGNOSIS — F203 Undifferentiated schizophrenia: Secondary | ICD-10-CM

## 2023-08-28 DIAGNOSIS — E46 Unspecified protein-calorie malnutrition: Secondary | ICD-10-CM | POA: Diagnosis not present

## 2023-08-28 DIAGNOSIS — F03911 Unspecified dementia, unspecified severity, with agitation: Secondary | ICD-10-CM | POA: Diagnosis not present

## 2023-08-28 DIAGNOSIS — Z1330 Encounter for screening examination for mental health and behavioral disorders, unspecified: Secondary | ICD-10-CM

## 2023-08-28 MED ORDER — TRAZODONE HCL 50 MG PO TABS: 25.0000 mg | ORAL_TABLET | Freq: Two times a day (BID) | ORAL | 3 refills | Status: DC

## 2023-08-28 NOTE — Patient Instructions (Signed)
 En la manana trata el trazadone por mitad

## 2023-08-28 NOTE — Assessment & Plan Note (Signed)
 Last in office weight steady. Weight steady at home. Continue liberalized nutrition. Continue mirtazapine.

## 2023-08-28 NOTE — Assessment & Plan Note (Signed)
 Managed with Rexulti , currently stable. Improvement with trazodone  at bedtime. She is using trazodone  for agitation prn as well and working well. Instructed to half the prn day time dose in order to lessen heavy leg side effect. Otherwise, regimen seems to be going well. No risperidal. Continue remeron  7.5mg , rexulti  0.25mg  and trazadone 25-50mg  BID.  -Continue Rexulti  at current dose. -Consider increasing Rexulti  to 0.5mg  at follow-up if needed. - f/u in 3 months.

## 2023-08-28 NOTE — Progress Notes (Signed)
 Telehealth Visit  I connected with  Diana Acosta on 08/28/23 by a video enabled telemedicine application and verified that I am speaking with the correct person using two identifiers.   I discussed the limitations of evaluation and management by telemedicine. The patient expressed understanding and agreed to proceed.  Subjective:    Patient ID: Diana Acosta, female    DOB: [birth year], 89 y.o.   MRN: [id number]  HPI: Diana Acosta is a 89 y.o. female  Chief Complaint  Patient presents with   Dementia    Discussed the use of AI scribe software for clinical note transcription with the patient, who gave verbal consent to proceed.  History of Present Illness   The patient, with a history of insomnia and agitation, has been on Trazodone  and Rexulti . She reports that the Trazodone  has been effective in managing her symptoms, but not completely. The medication was initially given at night, but due to persistent symptoms, an additional dose was introduced in the morning. This regimen has been effective for about a week and a half, with the patient sleeping well for the past five nights. However, she reports experiencing heavy legs, particularly in the morning after the Trazodone  dose.  The patient's caregiver has been managing the medication regimen and reports no changes in other medications. The patient's agitation, characterized by restlessness and increased talkativeness, has been well controlled with the current regimen. The caregiver has expressed concern about reducing the Trazodone  dose due to fear of symptom recurrence.  The patient's Rexulti  dose remains at the lowest level, with no reported side effects or issues. The caregiver has expressed satisfaction with the current regimen, except for the patient's heavy legs. She is considering a three-month medication supply for convenience and stability.     Relevant past medical, surgical, family and social  history reviewed and updated as indicated. Interim medical history since our last visit reviewed. Allergies and medications reviewed and updated.  ROS per HPI unless specifically indicated above     Objective:    There were no vitals taken for this visit.  Wt Readings from Last 3 Encounters:  07/31/23 92 lb (41.7 kg)  07/19/23 84 lb (38.1 kg)  05/09/23 84 lb 3.2 oz (38.2 kg)     Physical Exam Constitutional:      General: She is not in acute distress.    Appearance: Normal appearance.  Neurological:     General: No focal deficit present.     Mental Status: She is alert. Mental status is at baseline.      LIMITED EXAM GIVEN VIDEO VISIT     Assessment & Plan:  Assessment & Plan   Dementia with agitation, unspecified dementia severity, unspecified dementia type (HCC) Undifferentiated schizophrenia (HCC) Assessment & Plan: Managed with Rexulti , currently stable. Improvement with trazodone  at bedtime. She is using trazodone  for agitation prn as well and working well. Instructed to half the prn day time dose in order to lessen heavy leg side effect. Otherwise, regimen seems to be going well. No risperidal. Continue remeron  7.5mg , rexulti  0.25mg  and trazadone 25-50mg  BID.  -Continue Rexulti  at current dose. -Consider increasing Rexulti  to 0.5mg  at follow-up if needed. - f/u in 3 months.  Orders: -     traZODone  HCl; Take 0.5-1 tablets (25-50 mg total) by mouth 2 (two) times daily.  Dispense: 180 tablet; Refill: 3  Protein-calorie malnutrition, unspecified severity (HCC) Assessment & Plan: Last in office weight steady. Weight steady at home. Continue  liberalized nutrition. Continue mirtazapine .  Follow up plan: Return in about 3 months (around 11/26/2023).  Jatavia Keltner P Jilleen Essner, MD   This visit was completed via video visit through MyChart due to the restrictions of the COVID-19 pandemic. All issues as above were discussed and addressed. Physical exam was done as above through  visual confirmation on video through MyChart. If it was felt that the patient should be evaluated in the office, they were directed there. The patient verbally consented to this visit. Location of the patient: home Location of the provider: work Those involved with this call:  Provider: Hadassah Nett, MD Time spent on call:  10 minutes with patient face to face via video conference. More than 50% of this time was spent in counseling and coordination of care. 10 minutes total spent in review of patient's record and preparation of their chart. Total time spent on this encounter: 20 minutes.

## 2023-09-10 ENCOUNTER — Telehealth: Payer: Self-pay | Admitting: Pediatrics

## 2023-09-10 NOTE — Telephone Encounter (Signed)
Prescription Request  09/10/2023  LOV: 07/18/2023  What is the name of the medication or equipment? traZODone (DESYREL) 50 MG   Have you contacted your pharmacy to request a refill? Yes   Which pharmacy would you like this sent to?  [facility] [address] ([address]), [place] - [address] [place]) [place] [address] Phone: [phone] Fax: [phone]    Patient notified that their request is being sent to the clinical staff for review and that they should receive a response within 2 business days.   Please advise at Mobile [phone] (mobile)   Sister came by to request refill. States she has been having to give sister more because she can not fall asleep. Pharmacy told her she needed to contact her provider.

## 2023-09-11 ENCOUNTER — Other Ambulatory Visit: Payer: Self-pay | Admitting: Pediatrics

## 2023-09-11 DIAGNOSIS — F03911 Unspecified dementia, unspecified severity, with agitation: Secondary | ICD-10-CM

## 2023-09-11 DIAGNOSIS — F203 Undifferentiated schizophrenia: Secondary | ICD-10-CM

## 2023-09-11 NOTE — Telephone Encounter (Unsigned)
Copied from CRM [id number]. Topic: General - Other >> Sep 11, 2023  1:13 PM Everette C wrote: Reason for CRM: Medication Refill - Most Recent Primary Care Visit:  Provider: Jackolyn Confer Department: [facility] Visit Type: MYCHART VIDEO VISIT Date: 08/28/2023  Medication: traZODone (DESYREL) 50 MG tablet [[id number]]  Has the patient contacted their pharmacy? Yes (Agent: If no, request that the patient contact the pharmacy for the refill. If patient does not wish to contact the pharmacy document the reason why and proceed with request.) (Agent: If yes, when and what did the pharmacy advise?)  Is this the correct pharmacy for this prescription? Yes If no, delete pharmacy and type the correct one.  This is the patient's preferred pharmacy:  [facility] [address] ([address]), [place] - [address] [place]) [place] [address] Phone: [phone] Fax: [phone]   Has the prescription been filled recently? Yes  Is the patient out of the medication? Yes  Has the patient been seen for an appointment in the last year OR does the patient have an upcoming appointment? Yes  Can we respond through MyChart? No  Agent: Please be advised that Rx refills may take up to 3 business days. We ask that you follow-up with your pharmacy.

## 2023-09-12 ENCOUNTER — Other Ambulatory Visit: Payer: Self-pay

## 2023-09-12 DIAGNOSIS — F03911 Unspecified dementia, unspecified severity, with agitation: Secondary | ICD-10-CM

## 2023-09-12 DIAGNOSIS — F203 Undifferentiated schizophrenia: Secondary | ICD-10-CM

## 2023-09-12 NOTE — Telephone Encounter (Signed)
Processed to PCP.

## 2023-09-13 NOTE — Telephone Encounter (Signed)
Refilled 08/28/23 # 180 with 3 refills. Requested Prescriptions  Refused Prescriptions Disp Refills   traZODone (DESYREL) 50 MG tablet 180 tablet 3    Sig: Take 0.5-1 tablets (25-50 mg total) by mouth 2 (two) times daily.     Psychiatry: Antidepressants - Serotonin Modulator Passed - 09/13/2023 11:57 AM      Passed - Valid encounter within last 6 months    Recent Outpatient Visits           2 weeks ago Dementia with agitation, unspecified dementia severity, unspecified dementia type [facility])   [place] [facility] Jackolyn Confer, MD   1 month ago Dementia with agitation, unspecified dementia severity, unspecified dementia type [facility])   [place] [facility] Jackolyn Confer, MD   1 month ago Dementia with agitation, unspecified dementia severity, unspecified dementia type [facility])   [place] [facility] Jackolyn Confer, MD   1 month ago Dementia with agitation, unspecified dementia severity, unspecified dementia type [facility])   [place] [facility] Jackolyn Confer, MD   2 months ago Undifferentiated schizophrenia [facility])   [place] [facility] Jackolyn Confer, MD       Future Appointments             In 2 months Evelene Croon, Atilano Median, MD [place] [facility], [facility]

## 2023-09-14 MED ORDER — TRAZODONE HCL 50 MG PO TABS: 25.0000 mg | ORAL_TABLET | Freq: Two times a day (BID) | ORAL | 3 refills | Status: DC

## 2023-10-25 ENCOUNTER — Other Ambulatory Visit: Payer: Self-pay | Admitting: Pediatrics

## 2023-10-25 DIAGNOSIS — F03911 Unspecified dementia, unspecified severity, with agitation: Secondary | ICD-10-CM

## 2023-10-25 NOTE — Telephone Encounter (Signed)
 Requested medication (s) are due for refill today: Yes  Requested medication (s) are on the active medication list: Yes  Last refill:  08/09/24  Future visit scheduled: Yes  Notes to clinic:  Unable to refill due to no refill protocol for this medication.      Requested Prescriptions  Pending Prescriptions Disp Refills   brexpiprazole (REXULTI) 0.25 MG TABS tablet 30 tablet 2    Sig: Take 1 tablet (0.25 mg total) by mouth daily.     Off-Protocol Failed - 10/25/2023  5:53 PM      Failed - Medication not assigned to a protocol, review manually.      Passed - Valid encounter within last 12 months    Recent Outpatient Visits           1 month ago Dementia with agitation, unspecified dementia severity, unspecified dementia type [facility])   [place] [facility] Jackolyn Confer, MD   2 months ago Dementia with agitation, unspecified dementia severity, unspecified dementia type [facility])   [place] [facility] Jackolyn Confer, MD   2 months ago Dementia with agitation, unspecified dementia severity, unspecified dementia type [facility])   [place] [facility] Jackolyn Confer, MD   3 months ago Dementia with agitation, unspecified dementia severity, unspecified dementia type [facility])   [place] [facility] Jackolyn Confer, MD   4 months ago Undifferentiated schizophrenia [facility])   [place] [facility] Jackolyn Confer, MD       Future Appointments             In 1 month Evelene Croon, Atilano Median, MD [facility], [facility]

## 2023-10-25 NOTE — Telephone Encounter (Signed)
 Copied from CRM [id number]. Topic: Clinical - Medication Refill >> Oct 25, 2023 12:11 PM Turkey B wrote: Most Recent Primary Care Visit:  Provider: Jackolyn Confer  Department: [facility]  Visit Type: MYCHART VIDEO VISIT  Date: 08/28/2023  Medication: brexpiprazole (REXULTI) 0.25 MG TABS tablet  Has the patient contacted their pharmacy? no Caller thought that had to call in  Is this the correct pharmacy for this prescription? yes  This is the patient's preferred pharmacy:  [facility] [address]), [place] - [address] [place]) [place] [address] Phone: [phone] Fax: [phone]   Has the prescription been filled recently? no  Is the patient out of the medication? no  Has the patient been seen for an appointment in the last year OR does the patient have an upcoming appointment? yes  Can we respond through MyChart? yes  Agent: Please be advised that Rx refills may take up to 3 business days. We ask that you follow-up with your pharmacy.

## 2023-10-26 MED ORDER — BREXPIPRAZOLE 0.25 MG PO TABS: 0.2500 mg | ORAL_TABLET | Freq: Every day | ORAL | 2 refills | Status: DC

## 2023-10-29 ENCOUNTER — Other Ambulatory Visit: Payer: Self-pay | Admitting: Pediatrics

## 2023-10-29 DIAGNOSIS — F203 Undifferentiated schizophrenia: Secondary | ICD-10-CM

## 2023-10-29 NOTE — Telephone Encounter (Signed)
 Copied from CRM [id number]. Topic: Clinical - Medication Refill >> Oct 29, 2023 11:48 AM Geroge Baseman wrote: Most Recent Primary Care Visit:  Provider: Jackolyn Confer  Department[facility]  Visit Type: MYCHART VIDEO VISIT  Date: 08/28/2023  Medication: mirtazapine (REMERON SOL-TAB) 15 MG disintegrating table  Has the patient contacted their pharmacy? Yes No refills  Is this the correct pharmacy for this prescription? Yes If no, delete pharmacy and type the correct one.  This is the patient's preferred pharmacy:  [facility] [address]), [place] - [address] [place]) [place] [address] Phone: [phone] Fax: [phone]   Has the prescription been filled recently? No  Is the patient out of the medication? No, only 6 left  Has the patient been seen for an appointment in the last year OR does the patient have an upcoming appointment? Yes  Can we respond through MyChart? No  Agent: Please be advised that Rx refills may take up to 3 business days. We ask that you follow-up with your pharmacy.

## 2023-11-06 ENCOUNTER — Encounter: Payer: Self-pay | Admitting: Pediatrics

## 2023-11-06 ENCOUNTER — Telehealth (INDEPENDENT_AMBULATORY_CARE_PROVIDER_SITE_OTHER): Admitting: Pediatrics

## 2023-11-06 DIAGNOSIS — F03911 Unspecified dementia, unspecified severity, with agitation: Secondary | ICD-10-CM

## 2023-11-06 DIAGNOSIS — F203 Undifferentiated schizophrenia: Secondary | ICD-10-CM | POA: Diagnosis not present

## 2023-11-06 MED ORDER — TRAZODONE HCL 50 MG PO TABS: 25.0000 mg | ORAL_TABLET | Freq: Two times a day (BID) | ORAL | 3 refills | Status: DC

## 2023-11-06 MED ORDER — BREXPIPRAZOLE 0.25 MG PO TABS: 0.2500 mg | ORAL_TABLET | Freq: Every day | ORAL | 3 refills | Status: DC

## 2023-11-06 MED ORDER — MIRTAZAPINE 15 MG PO TBDP: 7.5000 mg | ORAL_TABLET | Freq: Every day | ORAL | 3 refills | Status: DC

## 2023-11-06 NOTE — Assessment & Plan Note (Addendum)
 Insomnia symptoms improved with current trazodone and mirtazapine regimen. Adjusted medication timing reduced side effects and enhanced sleep quality. - Continue trazodone, REXULTI, and mirtazapine as prescribed. - Adjust medication timing to minimize side effects. - Transfer prescriptions to [facility] with home delivery.

## 2023-11-06 NOTE — Patient Instructions (Signed)
 Mande a la farmacia por correo con las misma dosis

## 2023-11-06 NOTE — Progress Notes (Signed)
 Telehealth Visit  I connected with  Diana Acosta on 11/06/23 by a video enabled telemedicine application and verified that I am speaking with the correct person using two identifiers.   I discussed the limitations of evaluation and management by telemedicine. The patient expressed understanding and agreed to proceed.  Today's Vitals   11/06/23 1302  BP: 123/74  Pulse: 66  Temp: (!) 97.3 F (36.3 C)   There is no height or weight on file to calculate BMI.   Subjective:    Patient ID: Diana Acosta, female    DOB: 1929/04/13, 89 y.o.   MRN: [id number]  HPI: Diana Acosta is a 89 y.o. female  Chief Complaint  Patient presents with   Dementia    Discussed the use of AI scribe software for clinical note transcription with the patient, who gave verbal consent to proceed.  History of Present Illness   Diana Acosta is a 89 year old female who presents with insomnia and medication management issues. She is accompanied by her caregiver.  She has been experiencing insomnia characterized by difficulty falling asleep and staying asleep. Over the past three weeks, her symptoms have improved slightly, with less time spent awake at night and quicker recovery from sleepless nights. No other sleep disturbances were noted.  She is currently using trazodone and mirtazapine to manage her symptoms. Trazodone is administered between 2 and 3 PM, and mirtazapine is given at 7:20 PM. She takes a 7.5 mg dose of mirtazapine and a full tablet of trazodone. Recently, she reduced the daytime dose of trazodone, which has helped with symptoms of restlessness and agitation. There was a period when she ran out of trazodone, leading to increased sleep disturbances, requiring more frequent use of mirtazapine. She currently has about 50 tablets of trazodone left from a 180-count prescription.  Her caregiver reports that she has been more stable recently, with less restlessness  and more agility since reducing the trazodone dose.      Relevant past medical, surgical, family and social history reviewed and updated as indicated. Interim medical history since our last visit reviewed. Allergies and medications reviewed and updated.  ROS per HPI unless specifically indicated above     Objective:    BP 123/74 Comment: unable to obtain  Pulse 66   Temp (!) 97.3 F (36.3 C)   Wt Readings from Last 3 Encounters:  07/31/23 92 lb (41.7 kg)  07/19/23 84 lb (38.1 kg)  05/09/23 84 lb 3.2 oz (38.2 kg)     Physical Exam Constitutional:      General: She is not in acute distress.    Appearance: Normal appearance.  Neurological:     General: No focal deficit present.     Mental Status: She is alert. Mental status is at baseline.      LIMITED EXAM GIVEN VIDEO VISIT     Assessment & Plan:  Assessment & Plan   Undifferentiated schizophrenia (HCC) Dementia with agitation, unspecified dementia severity, unspecified dementia type (HCC) Assessment & Plan: Insomnia symptoms improved with current trazodone and mirtazapine regimen. Adjusted medication timing reduced side effects and enhanced sleep quality. - Continue trazodone, REXULTI, and mirtazapine as prescribed. - Adjust medication timing to minimize side effects. - Transfer prescriptions to [facility] with home delivery.  Orders: -     traZODone HCl; Take 0.5-1 tablets (25-50 mg total) by mouth 2 (two) times daily.  Dispense: 180 tablet; Refill: 3 -  Mirtazapine; Take 0.5 tablets (7.5 mg total) by mouth at bedtime.  Dispense: 45 tablet; Refill: 3 -     Brexpiprazole; Take 1 tablet (0.25 mg total) by mouth daily.  Dispense: 90 tablet; Refill: 3   Follow up plan: 7/2/2025VIDEO  Jackolyn Confer, MD   This visit was completed via video visit through MyChart due to the restrictions of the COVID-19 pandemic. All issues as above were discussed and addressed. Physical exam was done as above through visual  confirmation on video through MyChart. If it was felt that the patient should be evaluated in the office, they were directed there. The patient verbally consented to this visit.  Location of the patient: home Location of the provider: work Those involved with this call:  Provider: Modena Nunnery, MD CMA: Maggie Font, CMA Time spent on call:  15 minutes with patient face to face via video conference. More than 50% of this time was spent in counseling and coordination of care. 15 minutes total spent in review of patient's record and preparation of their chart. Total time spent on this encounter: 30 minutes.

## 2023-11-06 NOTE — Assessment & Plan Note (Signed)
 Stable on current regimen, continue as below.

## 2023-11-27 ENCOUNTER — Ambulatory Visit: Payer: Self-pay | Admitting: Pediatrics

## 2023-12-03 ENCOUNTER — Emergency Department

## 2023-12-03 ENCOUNTER — Other Ambulatory Visit: Payer: Self-pay

## 2023-12-03 ENCOUNTER — Inpatient Hospital Stay
Admission: EM | Admit: 2023-12-03 | Discharge: 2023-12-06 | DRG: 871 | Disposition: A | Discharge status: Home / Self Care | Attending: Internal Medicine | Admitting: Internal Medicine

## 2023-12-03 DIAGNOSIS — L89123 Pressure ulcer of left upper back, stage 3: Secondary | ICD-10-CM | POA: Diagnosis present

## 2023-12-03 DIAGNOSIS — H9190 Unspecified hearing loss, unspecified ear: Secondary | ICD-10-CM | POA: Diagnosis present

## 2023-12-03 DIAGNOSIS — Z8249 Family history of ischemic heart disease and other diseases of the circulatory system: Secondary | ICD-10-CM | POA: Diagnosis not present

## 2023-12-03 DIAGNOSIS — R6520 Severe sepsis without septic shock: Secondary | ICD-10-CM | POA: Diagnosis present

## 2023-12-03 DIAGNOSIS — Z886 Allergy status to analgesic agent status: Secondary | ICD-10-CM

## 2023-12-03 DIAGNOSIS — A4151 Sepsis due to Escherichia coli [E. coli]: Secondary | ICD-10-CM | POA: Diagnosis present

## 2023-12-03 DIAGNOSIS — Z1152 Encounter for screening for COVID-19: Secondary | ICD-10-CM | POA: Diagnosis not present

## 2023-12-03 DIAGNOSIS — I10 Essential (primary) hypertension: Secondary | ICD-10-CM | POA: Diagnosis present

## 2023-12-03 DIAGNOSIS — N390 Urinary tract infection, site not specified: Secondary | ICD-10-CM | POA: Diagnosis present

## 2023-12-03 DIAGNOSIS — R569 Unspecified convulsions: Secondary | ICD-10-CM

## 2023-12-03 DIAGNOSIS — R7881 Bacteremia: Secondary | ICD-10-CM | POA: Insufficient documentation

## 2023-12-03 DIAGNOSIS — Z79899 Other long term (current) drug therapy: Secondary | ICD-10-CM | POA: Diagnosis not present

## 2023-12-03 DIAGNOSIS — A419 Sepsis, unspecified organism: Secondary | ICD-10-CM | POA: Diagnosis not present

## 2023-12-03 DIAGNOSIS — B9620 Unspecified Escherichia coli [E. coli] as the cause of diseases classified elsewhere: Secondary | ICD-10-CM | POA: Insufficient documentation

## 2023-12-03 DIAGNOSIS — Z8673 Personal history of transient ischemic attack (TIA), and cerebral infarction without residual deficits: Secondary | ICD-10-CM | POA: Diagnosis not present

## 2023-12-03 DIAGNOSIS — R5381 Other malaise: Secondary | ICD-10-CM | POA: Insufficient documentation

## 2023-12-03 DIAGNOSIS — F0390 Unspecified dementia without behavioral disturbance: Secondary | ICD-10-CM | POA: Diagnosis present

## 2023-12-03 DIAGNOSIS — G9340 Encephalopathy, unspecified: Secondary | ICD-10-CM | POA: Insufficient documentation

## 2023-12-03 DIAGNOSIS — G9341 Metabolic encephalopathy: Secondary | ICD-10-CM | POA: Diagnosis present

## 2023-12-03 DIAGNOSIS — Z9102 Food additives allergy status: Secondary | ICD-10-CM | POA: Diagnosis not present

## 2023-12-03 DIAGNOSIS — F209 Schizophrenia, unspecified: Secondary | ICD-10-CM | POA: Diagnosis present

## 2023-12-03 DIAGNOSIS — L8990 Pressure ulcer of unspecified site, unspecified stage: Secondary | ICD-10-CM | POA: Insufficient documentation

## 2023-12-03 DIAGNOSIS — Z91012 Allergy to eggs: Secondary | ICD-10-CM

## 2023-12-03 DIAGNOSIS — Z66 Do not resuscitate: Secondary | ICD-10-CM | POA: Diagnosis present

## 2023-12-03 DIAGNOSIS — E872 Acidosis, unspecified: Secondary | ICD-10-CM | POA: Diagnosis present

## 2023-12-03 DIAGNOSIS — E876 Hypokalemia: Secondary | ICD-10-CM | POA: Insufficient documentation

## 2023-12-03 DIAGNOSIS — E43 Unspecified severe protein-calorie malnutrition: Secondary | ICD-10-CM | POA: Diagnosis not present

## 2023-12-03 LAB — CBC
HCT: 28.2 % — ABNORMAL LOW (ref 36.0–46.0)
Hemoglobin: 9.1 g/dL — ABNORMAL LOW (ref 12.0–15.0)
MCH: 31.3 pg (ref 26.0–34.0)
MCHC: 32.3 g/dL (ref 30.0–36.0)
MCV: 96.9 fL (ref 80.0–100.0)
Platelets: 76 10*3/uL — ABNORMAL LOW (ref 150–400)
RBC: 2.91 MIL/uL — ABNORMAL LOW (ref 3.87–5.11)
RDW: 13.6 % (ref 11.5–15.5)
WBC: 9.4 10*3/uL (ref 4.0–10.5)
nRBC: 0 % (ref 0.0–0.2)

## 2023-12-03 LAB — BLOOD CULTURE ID PANEL (REFLEXED) - BCID2

## 2023-12-03 LAB — CBC WITH DIFFERENTIAL/PLATELET
Abs Immature Granulocytes: 0.03 10*3/uL (ref 0.00–0.07)
Basophils Absolute: 0 10*3/uL (ref 0.0–0.1)
Basophils Relative: 1 %
Eosinophils Absolute: 0 10*3/uL (ref 0.0–0.5)
Eosinophils Relative: 2 %
HCT: 36.4 % (ref 36.0–46.0)
Hemoglobin: 11.8 g/dL — ABNORMAL LOW (ref 12.0–15.0)
Immature Granulocytes: 2 %
Lymphocytes Relative: 9 %
Lymphs Abs: 0.2 10*3/uL — ABNORMAL LOW (ref 0.7–4.0)
MCH: 31.6 pg (ref 26.0–34.0)
MCHC: 32.4 g/dL (ref 30.0–36.0)
MCV: 97.3 fL (ref 80.0–100.0)
Monocytes Absolute: 0 10*3/uL — ABNORMAL LOW (ref 0.1–1.0)
Monocytes Relative: 1 %
Neutro Abs: 1.7 10*3/uL (ref 1.7–7.7)
Neutrophils Relative %: 85 %
Platelets: 107 10*3/uL — ABNORMAL LOW (ref 150–400)
RBC: 3.74 MIL/uL — ABNORMAL LOW (ref 3.87–5.11)
RDW: 13.6 % (ref 11.5–15.5)
WBC: 2 10*3/uL — ABNORMAL LOW (ref 4.0–10.5)
nRBC: 0 % (ref 0.0–0.2)

## 2023-12-03 LAB — URINALYSIS, W/ REFLEX TO CULTURE (INFECTION SUSPECTED)
Bilirubin Urine: NEGATIVE
Glucose, UA: NEGATIVE mg/dL
Ketones, ur: NEGATIVE mg/dL
Nitrite: NEGATIVE
Protein, ur: 100 mg/dL — AB
RBC / HPF: >50 RBC/hpf (ref 0–5)
Specific Gravity, Urine: 1.008 (ref 1.005–1.030)
WBC, UA: >50 WBC/hpf (ref 0–5)
pH: 7 (ref 5.0–8.0)

## 2023-12-03 LAB — COMPREHENSIVE METABOLIC PANEL WITH GFR
ALT: 15 U/L (ref 0–44)
AST: 27 U/L (ref 15–41)
Albumin: 3.7 g/dL (ref 3.5–5.0)
Alkaline Phosphatase: 78 U/L (ref 38–126)
Anion gap: 11 (ref 5–15)
BUN: 37 mg/dL — ABNORMAL HIGH (ref 8–23)
CO2: 26 mmol/L (ref 22–32)
Calcium: 8.6 mg/dL — ABNORMAL LOW (ref 8.9–10.3)
Chloride: 109 mmol/L (ref 98–111)
Creatinine, Ser: 1.1 mg/dL — ABNORMAL HIGH (ref 0.44–1.00)
GFR, Estimated: 47 mL/min — ABNORMAL LOW (ref >60)
Glucose, Bld: 137 mg/dL — ABNORMAL HIGH (ref 70–99)
Potassium: 4.2 mmol/L (ref 3.5–5.1)
Sodium: 146 mmol/L — ABNORMAL HIGH (ref 135–145)
Total Bilirubin: 1.5 mg/dL — ABNORMAL HIGH (ref 0.0–1.2)
Total Protein: 6.3 g/dL — ABNORMAL LOW (ref 6.5–8.1)

## 2023-12-03 LAB — PROTIME-INR
INR: 1.3 — ABNORMAL HIGH (ref 0.8–1.2)
INR: 1.5 — ABNORMAL HIGH (ref 0.8–1.2)
Prothrombin Time: 16.1 s — ABNORMAL HIGH (ref 11.4–15.2)
Prothrombin Time: 18.3 s — ABNORMAL HIGH (ref 11.4–15.2)

## 2023-12-03 LAB — RESP PANEL BY RT-PCR (RSV, FLU A&B, COVID)  RVPGX2
Influenza A by PCR: NEGATIVE
Influenza B by PCR: NEGATIVE
Resp Syncytial Virus by PCR: NEGATIVE
SARS Coronavirus 2 by RT PCR: NEGATIVE

## 2023-12-03 LAB — LACTIC ACID, PLASMA
Lactic Acid, Venous: 2.8 mmol/L (ref 0.5–1.9)
Lactic Acid, Venous: 1.9 mmol/L (ref 0.5–1.9)

## 2023-12-03 LAB — CBG MONITORING, ED: Glucose-Capillary: 95 mg/dL (ref 70–99)

## 2023-12-03 LAB — CREATININE, SERUM
Creatinine, Ser: 0.75 mg/dL (ref 0.44–1.00)
GFR, Estimated: >60 mL/min (ref >60)

## 2023-12-03 LAB — PROCALCITONIN: Procalcitonin: 5.53 ng/mL

## 2023-12-03 LAB — CORTISOL-AM, BLOOD: Cortisol - AM: 30 ug/dL — ABNORMAL HIGH (ref 6.7–22.6)

## 2023-12-03 MED ORDER — LEVETIRACETAM IN NACL 1000 MG/100ML IV SOLN
1000.0000 mg | Freq: Once | INTRAVENOUS | Status: AC
  Administered 2023-12-03: 1000 mg via INTRAVENOUS
  Filled 2023-12-03: qty 100

## 2023-12-03 MED ORDER — LACTATED RINGERS IV BOLUS (SEPSIS)
1000.0000 mL | Freq: Once | INTRAVENOUS | Status: AC
  Administered 2023-12-03: 1000 mL via INTRAVENOUS

## 2023-12-03 MED ORDER — LACTATED RINGERS IV SOLN: INTRAVENOUS | Status: AC

## 2023-12-03 MED ORDER — SODIUM CHLORIDE 0.9 % IV SOLN
2.0000 g | INTRAVENOUS | Status: DC
  Administered 2023-12-04 – 2023-12-05 (×3): 2 g via INTRAVENOUS
  Filled 2023-12-03 (×3): qty 20

## 2023-12-03 MED ORDER — ONDANSETRON HCL 4 MG PO TABS: 4.0000 mg | ORAL_TABLET | Freq: Four times a day (QID) | ORAL | Status: DC | PRN

## 2023-12-03 MED ORDER — VANCOMYCIN HCL IN DEXTROSE 1-5 GM/200ML-% IV SOLN
1000.0000 mg | Freq: Once | INTRAVENOUS | Status: AC
  Administered 2023-12-03: 1000 mg via INTRAVENOUS
  Filled 2023-12-03: qty 200

## 2023-12-03 MED ORDER — HEPARIN SODIUM (PORCINE) 5000 UNIT/ML IJ SOLN
5000.0000 [IU] | Freq: Three times a day (TID) | INTRAMUSCULAR | Status: DC
  Administered 2023-12-03 – 2023-12-06 (×10): 5000 [IU] via SUBCUTANEOUS
  Filled 2023-12-03 (×10): qty 1

## 2023-12-03 MED ORDER — ACETAMINOPHEN 10 MG/ML IV SOLN
1000.0000 mg | Freq: Once | INTRAVENOUS | Status: AC
  Administered 2023-12-03: 1000 mg via INTRAVENOUS
  Filled 2023-12-03: qty 100

## 2023-12-03 MED ORDER — ACETAMINOPHEN 325 MG PO TABS
650.0000 mg | ORAL_TABLET | Freq: Four times a day (QID) | ORAL | Status: DC | PRN
  Administered 2023-12-05: 650 mg via ORAL
  Filled 2023-12-03: qty 2

## 2023-12-03 MED ORDER — METRONIDAZOLE 500 MG/100ML IV SOLN
500.0000 mg | Freq: Once | INTRAVENOUS | Status: AC
  Administered 2023-12-03: 500 mg via INTRAVENOUS
  Filled 2023-12-03: qty 100

## 2023-12-03 MED ORDER — SODIUM CHLORIDE 0.9 % IV SOLN
2.0000 g | Freq: Once | INTRAVENOUS | Status: AC
  Administered 2023-12-03: 2 g via INTRAVENOUS
  Filled 2023-12-03: qty 12.5

## 2023-12-03 MED ORDER — ACETAMINOPHEN 650 MG RE SUPP: 650.0000 mg | Freq: Four times a day (QID) | RECTAL | Status: DC | PRN

## 2023-12-03 MED ORDER — LORAZEPAM 2 MG/ML IJ SOLN
1.0000 mg | Freq: Once | INTRAMUSCULAR | Status: AC
  Administered 2023-12-03: 1 mg via INTRAVENOUS
  Filled 2023-12-03: qty 1

## 2023-12-03 MED ORDER — POLYETHYLENE GLYCOL 3350 17 G PO PACK: 17.0000 g | PACK | Freq: Every day | ORAL | Status: DC | PRN

## 2023-12-03 MED ORDER — ONDANSETRON HCL 4 MG/2ML IJ SOLN: 4.0000 mg | Freq: Four times a day (QID) | INTRAMUSCULAR | Status: DC | PRN

## 2023-12-03 NOTE — Progress Notes (Signed)
 OT Cancellation Note  Patient Details Name: Diana Acosta MRN: [id number] DOB: [birth year]   Cancelled Treatment:    Reason Eval/Treat Not Completed: Patient at procedure or test/ unavailable. Order received, chart reviewed. On arrival pt out of room, seen in hallway moving to new room. Pt sleeping soundly during transfer, RN and family giving report, pt unavailable for therapy at this time. Will re-attempt as available to initiate services.   Gordan Latina, M.S. OTR/L  12/03/23, 10:07 AM  ascom [phone]

## 2023-12-03 NOTE — ED Triage Notes (Addendum)
 Pt arrives via EMS from home for tremors that started at night on 12/02/23. Pt has alzheimer's and is nonverbal. Pt is normally ambulatory with assistance.  EMS vitals: 101F 114/84 120 HR 130 CBG

## 2023-12-03 NOTE — Progress Notes (Signed)
 Pt being followed by [facility] for Sepsis protocol.

## 2023-12-03 NOTE — Progress Notes (Signed)
 PHARMACY - PHYSICIAN COMMUNICATION CRITICAL VALUE ALERT - BLOOD CULTURE IDENTIFICATION (BCID)  Diana Acosta is an 89 y.o. female who presented to [facility] on 12/03/2023 with a chief complaint of weakness and decreased appetite  Assessment:  4/21 blood culture with GNR, BCID detects E coli (no resistance).  Concern for UTI.     Name of physician (or Provider) Contacted: Dr Butch Cashing  Current antibiotics: Ceftriaxone   Changes to prescribed antibiotics recommended:  Patient is on recommended antibiotics - No changes needed  Results for orders placed or performed during the hospital encounter of 12/03/23  Blood Culture ID Panel (Reflexed) (Collected: 12/03/2023  1:41 AM)  Result Value Ref Range   Enterococcus faecalis NOT DETECTED NOT DETECTED   Enterococcus Faecium NOT DETECTED NOT DETECTED   Listeria monocytogenes NOT DETECTED NOT DETECTED   Staphylococcus species NOT DETECTED NOT DETECTED   Staphylococcus aureus (BCID) NOT DETECTED NOT DETECTED   Staphylococcus epidermidis NOT DETECTED NOT DETECTED   Staphylococcus lugdunensis NOT DETECTED NOT DETECTED   Streptococcus species NOT DETECTED NOT DETECTED   Streptococcus agalactiae NOT DETECTED NOT DETECTED   Streptococcus pneumoniae NOT DETECTED NOT DETECTED   Streptococcus pyogenes NOT DETECTED NOT DETECTED   A.calcoaceticus-baumannii NOT DETECTED NOT DETECTED   Bacteroides fragilis NOT DETECTED NOT DETECTED   Enterobacterales DETECTED (A) NOT DETECTED   Enterobacter cloacae complex NOT DETECTED NOT DETECTED   Escherichia coli DETECTED (A) NOT DETECTED   Klebsiella aerogenes NOT DETECTED NOT DETECTED   Klebsiella oxytoca NOT DETECTED NOT DETECTED   Klebsiella pneumoniae NOT DETECTED NOT DETECTED   Proteus species NOT DETECTED NOT DETECTED   Salmonella species NOT DETECTED NOT DETECTED   Serratia marcescens NOT DETECTED NOT DETECTED   Haemophilus influenzae NOT DETECTED NOT DETECTED   Neisseria meningitidis NOT DETECTED  NOT DETECTED   Pseudomonas aeruginosa NOT DETECTED NOT DETECTED   Stenotrophomonas maltophilia NOT DETECTED NOT DETECTED   Candida albicans NOT DETECTED NOT DETECTED   Candida auris NOT DETECTED NOT DETECTED   Candida glabrata NOT DETECTED NOT DETECTED   Candida krusei NOT DETECTED NOT DETECTED   Candida parapsilosis NOT DETECTED NOT DETECTED   Candida tropicalis NOT DETECTED NOT DETECTED   Cryptococcus neoformans/gattii NOT DETECTED NOT DETECTED   CTX-M ESBL NOT DETECTED NOT DETECTED   Carbapenem resistance IMP NOT DETECTED NOT DETECTED   Carbapenem resistance KPC NOT DETECTED NOT DETECTED   Carbapenem resistance NDM NOT DETECTED NOT DETECTED   Carbapenem resist OXA 48 LIKE NOT DETECTED NOT DETECTED   Carbapenem resistance VIM NOT DETECTED NOT DETECTED    Valma Gazella, PharmD, BCPS, BCIDP Work Cell: [phone] 12/03/2023 12:43 PM

## 2023-12-03 NOTE — H&P (Signed)
 History and Physical  Diana Acosta ZOX:[id number] DOB: Nov 09, 1928 DOA: 12/03/2023 PCP: Hadassah Letters, MD  Chief Complaint: fever Historian: patient's daughter, niece   HPI:  Diana Acosta is a 89 y.o. female with a PMH significant for schizophrenia, advanced dementia, history of CVA. At baseline, they live with their sister and are ambulatory with assistance but otherwise completely dependent for ADLs.  Nonverbal with the exception of intermittent expressions.  Spanish only speaking and hard of hearing requiring hearing aid.  They presented from home to the ED on 12/03/2023 with weakness beginning 4/20.  History is obtained by account of sister who lives with the patient and is her primary caregiver.  Sister states that throughout the course the day patient had mild weakness and decreased appetite.  Was not able to ambulate as she normally does.  Therefore, sister fed the patient her food well the patient was laying in bed.  Denies any signs of choking or aspiration.  Overnight when patient is sleeping she typically makes a noise to indicate that she has to go to the bathroom.  Last night, she made this noise so sister attempted to get her to the commode but patient was unable to stand.  Sister states that patient felt very hot as though she had a fever but did not measure her temperature.  She also had transient tremor of her upper extremities similar to having the chills.  Patient was unable to express any concerns.  When asked if she had any pain she said no.  It did appear however that she was having difficulties with urinating throughout the day per her sister report.  No reported vomiting or diarrhea.  States that patient has never acted in this manner previously.  In the ED, it was found that they had temperature 102.6, heart rate in 120s, respiratory rate 12-21, blood pressures elevated as high as 166/81.  O2 saturations are 93% and above on room air.  Significant findings  included: Negative COVID, influenza, RSV.  LA 2.8>1.9 s/p IV fluid bolus.  Na+ 146, K+ 4.2, glucose 137, creatinine 1.1, total bilirubin 1.5.  WBC 2.0, Hgb 11.8, platelets 107.  Urinalysis significant for infection including large hemoglobin 100 protein, large leukocytes, greater than 50 RBCs and WBC, many bacteria.  Procalcitonin 5.53.  Urine and blood culture sent Chest x-ray shows stable cardiomegaly without acute or active cardiopulmonary disease Head CT: No evidence of acute intracranial abnormalities  They were initially treated with Ativan , Keppra  as there was concerned that the described shaking may be consistent with a seizure initially. Also treated for sepsis with broad-spectrum antibiotics including Flagyl , vancomycin , cefepime , IV fluids  Patient was admitted to medicine service for further workup and management of urosepsis as outlined in detail below.  Assessment/Plan Principal Problem:   Sepsis (HCC)   Urosepsis-as indicated by signs consistent with urinary frequency, hesitancy as well as pyuria.  No recent antibiotic use.  Severe sepsis criteria met by fever, tachycardia, tachypnea, leukopenia, lactic acidosis, signs of infection urine. - Continue IV ceftriaxone  and follow culture data to guide modifications - Continue IV fluids until tolerating appropriate p.o. intake - Supportive care including Tylenol  - Strict I's/O  Advanced dementia  schizophrenia-at baseline is ambulatory with one-person assist.  Intermittently she does not have consistent verbal interactions but can occasionally answer questions.  She is not oriented to location or time. -Continue home brexpiprazole , Remeron  once tolerating p.o. - PT/OT/SLP  Hypertension-patient is prescribed amlodipine  however, has not been using  due to concerns for hypotension at home.  She is mildly hypertensive here but will continue to hold her blood pressure medications in setting of severe sepsis and can titrate back on as  needed  Hard of hearing-please ensure hearing aid in place and using Spanish interpreter in order to communicate with patient  Past Medical History:  Diagnosis Date   Anxiety    Dementia (HCC)    Hypertension    Panic attacks    Stroke [facility])     Past Surgical History:  Procedure Laterality Date   HIP SURGERY  2013   MANDIBLE FRACTURE SURGERY       reports that she has never smoked. She has never used smokeless tobacco. She reports that she does not drink alcohol and does not use drugs.  Allergies  Allergen Reactions   Aspirin Nausea Only and Other (See Comments)    Acidity   Chocolate Flavoring Agent (Non-Screening) Other (See Comments)   Egg Solids, Whole Other (See Comments)    Family History  Problem Relation Age of Onset   Heart failure Mother    Coronary artery disease Father     Prior to Admission medications   Medication Sig Start Date End Date Taking? Authorizing Provider  Ascorbic Acid (VITAMIN C) 500 MG CAPS Take 500 mg by mouth daily.    [provider]  brexpiprazole  (REXULTI ) 0.25 MG TABS tablet Take 1 tablet (0.25 mg total) by mouth daily. 11/06/23 10/31/24  Hadassah Letters, MD  Cholecalciferol (VITAMIN D-3) 125 MCG (5000 UT) TABS Take 125 mcg by mouth daily.    [provider]  Cyanocobalamin (B-12) 2500 MCG TABS Take 2,500 mcg by mouth daily.    [provider]  folic acid (FOLVITE) 400 MCG tablet Take 200 mcg by mouth daily.    [provider]  Glucosamine 750 MG TABS Take 750 mg by mouth daily.    [provider]  Magnesium Citrate 125 MG CAPS Take 125 mg by mouth daily.    [provider]  mirtazapine  (REMERON  SOL-TAB) 15 MG disintegrating tablet Take 0.5 tablets (7.5 mg total) by mouth at bedtime. 11/06/23 11/05/24  Hadassah Letters, MD  SM MELATONIN PO Take 250 mg by mouth daily.    [provider]  traZODone  (DESYREL ) 50 MG tablet Take 0.5-1 tablets (25-50 mg total) by mouth 2 (two) times  daily. 11/06/23 11/05/24  Hadassah Letters, MD   I have personally, briefly reviewed patient's prior medical records in [facility]  Objective: Blood pressure (!) 166/81, pulse (!) 106, temperature (!) 102.6 F (39.2 C), temperature source Rectal, resp. rate 12, SpO2 93%.   Constitutional: NAD, calm, comfortable.  Lethargic.  Opens eyes to voice. HEENT: Mild exudate bilateral eyes.  Dry mucous membranes. Posterior pharynx clear of any exudate or lesions. Normal dentition.  Neck: normal, supple, no masses, no thyromegaly Respiratory: CTAB, no wheezing, no crackles. Normal respiratory effort. No accessory muscle use.  Cardiovascular: RRR, no murmurs / rubs / gallops. No extremity edema. 2+ pedal pulses. no clubbing / cyanosis.  Abdomen: soft, NT, ND, no masses or HSM palpated.  No suprapubic tenderness.  No guarding or rebound Musculoskeletal: Very low muscle tone Skin: dry, intact, normal color, normal temperature on exposed skin.  Superficial skin tear on left scapular area Neurologic: Lethargic, unable to follow commands.  Unable to hear commands due to not having her hearing aid. Psychiatric: Unresponsive  Labs on Admission: I have personally reviewed admission labs and imaging  studies  CBC    Component Value Date/Time   WBC 2.0 (L) 12/03/2023 0141   RBC 3.74 (L) 12/03/2023 0141   HGB 11.8 (L) 12/03/2023 0141   HCT 36.4 12/03/2023 0141   PLT 107 (L) 12/03/2023 0141   MCV 97.3 12/03/2023 0141   MCH 31.6 12/03/2023 0141   MCHC 32.4 12/03/2023 0141   RDW 13.6 12/03/2023 0141   LYMPHSABS 0.2 (L) 12/03/2023 0141   MONOABS 0.0 (L) 12/03/2023 0141   EOSABS 0.0 12/03/2023 0141   BASOSABS 0.0 12/03/2023 0141   CMP     Component Value Date/Time   NA 146 (H) 12/03/2023 0141   K 4.2 12/03/2023 0141   CL 109 12/03/2023 0141   CO2 26 12/03/2023 0141   GLUCOSE 137 (H) 12/03/2023 0141   BUN 37 (H) 12/03/2023 0141   CREATININE 1.10 (H) 12/03/2023 0141   CALCIUM 8.6 (L) 12/03/2023  0141   PROT 6.3 (L) 12/03/2023 0141   ALBUMIN 3.7 12/03/2023 0141   AST 27 12/03/2023 0141   ALT 15 12/03/2023 0141   ALKPHOS 78 12/03/2023 0141   BILITOT 1.5 (H) 12/03/2023 0141   GFRNONAA 47 (L) 12/03/2023 0141    Radiological Exams on Admission: DG Chest Port 1 View Result Date: 12/03/2023 CLINICAL DATA:  Tremors. EXAM: PORTABLE CHEST 1 VIEW COMPARISON:  November 15, 2021 FINDINGS: The cardiac silhouette is enlarged and unchanged in size. Stable right-sided tracheal deviation is noted which may be secondary to an enlarged thyroid gland. There is no evidence of acute infiltrate, pleural effusion or pneumothorax. Multilevel degenerative changes are seen throughout the thoracic spine. IMPRESSION: Stable cardiomegaly, without acute or active cardiopulmonary disease. Electronically Signed   By: Virgle Grime M.D.   On: 12/03/2023 03:31   CT HEAD WO CONTRAST ( ) Result Date: 12/03/2023 CLINICAL DATA:  Seizure, new-onset, no history of trauma EXAM: CT HEAD WITHOUT CONTRAST TECHNIQUE: Contiguous axial images were obtained from the base of the skull through the vertex without intravenous contrast. RADIATION DOSE REDUCTION: This exam was performed according to the departmental dose-optimization program which includes automated exposure control, adjustment of the mA and/or kV according to patient size and/or use of iterative reconstruction technique. COMPARISON:  CT head April 4, 23. FINDINGS: Brain: No evidence of acute infarction, hemorrhage, hydrocephalus, extra-axial collection or mass lesion/mass effect. Similar remote right occipital infarct. Similar patchy white matter hypodensities, compatible with chronic microvascular ischemic disease. Cerebral atrophy. Vascular: No hyperdense vessel. Skull: No acute fracture. Sinuses/Orbits: Clear sinuses.  No acute findings. IMPRESSION: 1. No evidence of acute intracranial abnormality. 2. Similar remote right occipital infarct and chronic microvascular ischemic  disease. Electronically Signed   By: Stevenson Elbe M.D.   On: 12/03/2023 03:07   EKG: Independently reviewed.  Sinus tachycardia  DVT prophylaxis: heparin  injection 5,000 Units Start: 12/03/23 0600   Code Status: Full.  Family members expressed explicitly that they want her to be full code Family Communication: Sister and niece at bedside Disposition Plan: Admit to stepdown Consults called: None at this time  Ree Candy, DO [facility]  12/03/2023, 5:56 AM    To contact the appropriate [facility] Attending or Consulting provider: Check [email or web address] for coverage from 7pm-7am

## 2023-12-03 NOTE — Evaluation (Signed)
 Clinical/Bedside Swallow Evaluation Patient Details  Name: Diana Acosta MRN: [id number] Date of Birth: [birth year]  Today's Date: 12/03/2023 Time: SLP Start Time (ACUTE ONLY): 0850 SLP Stop Time (ACUTE ONLY): 0910 SLP Time Calculation (min) (ACUTE ONLY): 20 min  Past Medical History:  Past Medical History:  Diagnosis Date   Anxiety    Dementia (HCC)    Hypertension    Panic attacks    Stroke [facility])    Past Surgical History:  Past Surgical History:  Procedure Laterality Date   HIP SURGERY  2013   MANDIBLE FRACTURE SURGERY     HPI:  Diana Acosta is a 89 y.o. female with a PMH significant for schizophrenia, advanced dementia, history of CVA.  At baseline, they live with their sister and are ambulatory with assistance but otherwise completely dependent for ADLs.  Nonverbal with the exception of intermittent expressions.  Spanish only speaking and hard of hearing requiring hearing aid.     They presented from home to the ED on 12/03/2023 with weakness beginning 4/20.  History is obtained by account of sister who lives with the patient and is her primary caregiver.  Sister states that throughout the course the day patient had mild weakness and decreased appetite.  Was not able to ambulate as she normally does.  Therefore, sister fed the patient her food well the patient was laying in bed.  Denies any signs of choking or aspiration.  Overnight when patient is sleeping she typically makes a noise to indicate that she has to go to the bathroom.  Last night, she made this noise so sister attempted to get her to the commode but patient was unable to stand.  Sister states that patient felt very hot as though she had a fever but did not measure her temperature.  She also had transient tremor of her upper extremities similar to having the chills.  Patient was unable to express any concerns.  When asked if she had any pain she said no.  It did appear however that she was having difficulties  with urinating throughout the day per her sister report.  No reported vomiting or diarrhea.  States that patient has never acted in this manner previously. Advanced dementia  schizophrenia-at baseline is ambulatory with one-person assist.  Intermittently she does not have consistent verbal interactions but can occasionally answer questions.  She is not oriented to location or time. CXR 12/03/23: Stable cardiomegaly, without acute or active cardiopulmonary  disease. Head CT No evidence of acute intracranial abnormality.  2. Similar remote right occipital infarct and chronic microvascular  ischemic disease.    Assessment / Plan / Recommendation  Clinical Impression  Pt seen for bedside swallow assessment in the setting of urosepsis, now with AMS and lethargy (ativan  given overnight and likely impacting presentation). Pt resting on room air with O2 saturations maintained at 100. Ipad interpreter utilized for completion of session. Pt did not rouse despite verbal, tactile, and thermal stimulation. Pt's sister Shelagh Derrick) and brother in law Danney Dutton) present for session and aided with history intake. Sister denied history of trouble with swallow, and reported intermittent, extended lethargy at baseline. Family reported total assist recently required for PO intake.   Given lethargy- recommend continued NPO with frequent oral care. MD and RN aware of recommendations. SLP will continue to follow. SLP Visit Diagnosis: Dysphagia, unspecified (R13.10)    Aspiration Risk  Moderate aspiration risk    Diet Recommendation   NPO  Medication Administration: Via alternative means  Other  Recommendations Oral Care Recommendations: Oral care QID;Staff/trained caregiver to provide oral care    Recommendations for follow up therapy are one component of a multi-disciplinary discharge planning process, led by the attending physician.  Recommendations may be updated based on patient status, additional functional criteria and  insurance authorization.  Follow up Recommendations Follow physician's recommendations for discharge plan and follow up therapies      Assistance Recommended at Discharge  Assist and supervision for PO intake  Functional Status Assessment Patient has had a recent decline in their functional status and demonstrates the ability to make significant improvements in function in a reasonable and predictable amount of time.  Frequency and Duration min 2x/week  2 weeks       Prognosis Prognosis for improved oropharyngeal function: Fair Barriers to Reach Goals: Cognitive deficits (lethargy)      Swallow Study   General Date of Onset: 12/03/23 HPI: Diana Acosta is a 89 y.o. female with a PMH significant for schizophrenia, advanced dementia, history of CVA.  At baseline, they live with their sister and are ambulatory with assistance but otherwise completely dependent for ADLs.  Nonverbal with the exception of intermittent expressions.  Spanish only speaking and hard of hearing requiring hearing aid.     They presented from home to the ED on 12/03/2023 with weakness beginning 4/20.  History is obtained by account of sister who lives with the patient and is her primary caregiver.  Sister states that throughout the course the day patient had mild weakness and decreased appetite.  Was not able to ambulate as she normally does.  Therefore, sister fed the patient her food well the patient was laying in bed.  Denies any signs of choking or aspiration.  Overnight when patient is sleeping she typically makes a noise to indicate that she has to go to the bathroom.  Last night, she made this noise so sister attempted to get her to the commode but patient was unable to stand.  Sister states that patient felt very hot as though she had a fever but did not measure her temperature.  She also had transient tremor of her upper extremities similar to having the chills.  Patient was unable to express any concerns.   When asked if she had any pain she said no.  It did appear however that she was having difficulties with urinating throughout the day per her sister report.  No reported vomiting or diarrhea.  States that patient has never acted in this manner previously. Advanced dementia  schizophrenia-at baseline is ambulatory with one-person assist.  Intermittently she does not have consistent verbal interactions but can occasionally answer questions.  She is not oriented to location or time. CXR 12/03/23: Stable cardiomegaly, without acute or active cardiopulmonary  disease. Head CT No evidence of acute intracranial abnormality.  2. Similar remote right occipital infarct and chronic microvascular  ischemic disease. Type of Study: Bedside Swallow Evaluation Previous Swallow Assessment: none in chart Diet Prior to this Study: NPO Temperature Spikes Noted: Yes (102.6; WBC 9.4) Respiratory Status: Room air History of Recent Intubation: No Behavior/Cognition: Lethargic/Drowsy Oral Cavity Assessment:  (limited assessment) Oral Care Completed by SLP: Recent completion by staff Patient Positioning: Upright in bed Baseline Vocal Quality:  (none observed) Volitional Cough: Cognitively unable to elicit Volitional Swallow: Unable to elicit    Oral/Motor/Sensory Function Overall Oral Motor/Sensory Function:  (limited assessment secondary to lethargy)   Ice Chips Ice chips: Not tested   Thin Liquid Thin  Liquid: Not tested                [place] Hera Celaya Clapp, MS, CCC-SLP Speech Language Pathologist Rehab Services; [facility] [phone] (ascom)   [place] J Clapp 12/03/2023,11:19 AM

## 2023-12-03 NOTE — ED Notes (Signed)
 Mitts placed on pt's hands at request of family at bedside, pt pulling at clothing and cords. Pt tolerated well.

## 2023-12-03 NOTE — Progress Notes (Signed)
 Courtesy note- No billing  Patient is seen and examine today morning. She is sleeping, but on and off agitation noted. Got Ativan  prior. She is admitted with sepsis of urologic origin. Continue IV fluids, IV antibiotics and supportive care.  I did discuss with patient sister to think about goals of care. Advised DNR/ DNI due to her advanced age, comorbidities. Family to have discussion before making a final decision. Anticipate 2-3 day hospital stay. She will need PT/OT, advance diet once more alert and awake. Discussed with patient sister, niece over phone.

## 2023-12-03 NOTE — Evaluation (Signed)
 Physical Therapy Evaluation Patient Details Name: Diana Acosta MRN: [id number] DOB: 08-01-1929 Today's Date: 12/03/2023  History of Present Illness  Diana Acosta is a 89yoF who comes [facility] on [facility]/03/23 with fever, rigors. Pt tachycardic, tachypnic. Pt presentation noted by sister who is HCPOA, as pt is nonverbal with dementia at baseline. Head CT revealing of "1 no evidence of acute intracranial abnormality.  2 similar remote right occipital infarct and chronic microvascular ischemic disease." PMH: dementia, schizophrenia, HTN, CVA.  Clinical Impression  Pt asleep on entry, still in ED- at bedside are pt's 3rd sister (the one that does not live with pt) and her husband (pt's BIL). Family provides background information, sounds as though pt requires extensive physical and cognitive assistance at home due to advances in her dementia over the past months. It sounds as though a hoyer lift would already have provided a great deal of reduction in caregiver burden over the past few weeks, however given present illness, anticipate would be even more instrumental in allowing for pt to successfully transition back to the home. Pt clearly requires SNF level care at baseline, however anticipate family will want to keep patient in the home as long as they are physically able to provide care. Will continue to follow to determine any changes in DC needs, however anticipate most patient mobility can be achieved via nursing and/or family caregivers while admitted.       If plan is discharge home, recommend the following: Two people to help with bathing/dressing/bathroom;Two people to help with walking and/or transfers;Direct supervision/assist for medications management;Direct supervision/assist for financial management;Assist for transportation;Help with stairs or ramp for entrance;Supervision due to cognitive status   Can travel by private vehicle   No    Equipment Recommendations Hoyer lift   Recommendations for Other Services       Functional Status Assessment Patient has had a recent decline in their functional status and demonstrates the ability to make significant improvements in function in a reasonable and predictable amount of time.     Precautions / Restrictions Precautions Precautions: Fall Recall of Precautions/Restrictions: Impaired Restrictions Weight Bearing Restrictions Per Provider Order: No      Mobility  Bed Mobility Overal bed mobility:  (not assessed, pt asleep and requires total assist for all mobility.)                  Transfers                        Ambulation/Gait                  Stairs            Wheelchair Mobility     Tilt Bed    Modified Rankin (Stroke Patients Only)       Balance                                             Pertinent Vitals/Pain Pain Assessment Pain Assessment:  (pt sleeping during my visit)    Home Living Family/patient expects to be discharged to:: Private residence Living Arrangements: Other relatives (2 sisters (a 3rd sister lives in the area)) Available Help at Discharge: Family (3 sisters, 1 BIL prn) Type of Home: House Home Access: Stairs to enter   [employer] of Steps: 3   Home  Layout: One level   Additional Comments: bed in living room and in living room for when she needs to rest    Prior Function Prior Level of Function : Needs assist  Cognitive Assist : Mobility (cognitive)     Physical Assist : Mobility (physical)     Mobility Comments: Pt is not expecially mobile at baseline, is given heavy physical assist from 2 sisters for transfers and short distance AMB in house, or 2 egress 3 stairs for appointments (has a transport chair) ADLs Comments: Pt requires total care assistance for ADL, will partially feed self, but not consistently and not enough to meet nutritional needs.     Extremity/Trunk Assessment                 Communication   Communication Factors Affecting Communication: Hearing impaired;Difficulty expressing self    Cognition Arousal: Lethargic     PT - Cognitive impairments: History of cognitive impairments                       PT - Cognition Comments: pt asleep at my visit, has baseline cognitive impairment from dementia         Cueing       General Comments      Exercises     Assessment/Plan    PT Assessment Patient needs continued PT services  PT Problem List Decreased strength;Decreased cognition;Decreased activity tolerance       PT Treatment Interventions DME instruction;Patient/family education;Therapeutic activities    PT Goals (Current goals can be found in the Care Plan section)  Acute Rehab PT Goals Patient Stated Goal: provide care in home for patient as long as able PT Goal Formulation: With patient Time For Goal Achievement: 12/17/23 Potential to Achieve Goals: Good    Frequency Min 1X/week     Co-evaluation               AM-PAC PT "6 Clicks" Mobility  Outcome Measure Help needed turning from your back to your side while in a flat bed without using bedrails?: Total Help needed moving from lying on your back to sitting on the side of a flat bed without using bedrails?: Total Help needed moving to and from a bed to a chair (including a wheelchair)?: Total Help needed standing up from a chair using your arms (e.g., wheelchair or bedside chair)?: Total Help needed to walk in hospital room?: Total Help needed climbing 3-5 steps with a railing? : Total 6 Click Score: 6    End of Session     Patient left: in bed;with family/visitor present Nurse Communication: Mobility status PT Visit Diagnosis: Other abnormalities of gait and mobility (R26.89);Other symptoms and signs involving the nervous system (R29.898)    Time: 1610-9604 PT Time Calculation (min) (ACUTE ONLY): 16 min   Charges:   PT Evaluation $PT Eval Low  Complexity: 1 Low   PT General Charges $$ ACUTE PT VISIT: 1 Visit        11:50 AM, 12/03/23 Dawn Eth, PT, DPT Physical Therapist - [facility]  [phone] (ASCOM)     Lenoria Narine C 12/03/2023, 11:46 AM

## 2023-12-03 NOTE — ED Provider Notes (Signed)
 [facility] Provider Note    Event Date/Time   First MD Initiated Contact with Patient 12/03/23 0131     (approximate)   History   Tremors   HPI  Diana Acosta is a 89 y.o. female with history of dementia, schizophrenia, hypertension, CVA seen on imaging without residual deficits who presents to the emergency department with family for concerns for fever, rigors.  Family reports that she is mostly nonverbal at baseline and that she will make noises at night when she needs to get up to the bathroom.  Her sister who is her power of attorney sleeps next to her and states she was awoken by the patient shaking more so in the left arm than the right.  She has chronic lip and tongue smacking.  She did have a right gaze preference here in the ED but no prior history of seizures.  Found to be febrile with EMS, tachycardic and tachypneic.  Normal blood glucose in the 130s.  Patient's sister reports patient is a full code.  History provided by patient's sister and niece.    Past Medical History:  Diagnosis Date   Anxiety    Dementia (HCC)    Hypertension    Panic attacks    Stroke [facility])     Past Surgical History:  Procedure Laterality Date   HIP SURGERY  2013   MANDIBLE FRACTURE SURGERY      MEDICATIONS:  Prior to Admission medications   Medication Sig Start Date End Date Taking? Authorizing Provider  Ascorbic Acid (VITAMIN C) 500 MG CAPS Take 500 mg by mouth daily.    [provider]  brexpiprazole  (REXULTI ) 0.25 MG TABS tablet Take 1 tablet (0.25 mg total) by mouth daily. 11/06/23 10/31/24  Hadassah Letters, MD  Cholecalciferol (VITAMIN D-3) 125 MCG (5000 UT) TABS Take 125 mcg by mouth daily.    [provider]  Cyanocobalamin (B-12) 2500 MCG TABS Take 2,500 mcg by mouth daily.    [provider]  folic acid (FOLVITE) 400 MCG tablet Take 200 mcg by mouth daily.    [provider]  Glucosamine 750 MG TABS Take 750  mg by mouth daily.    [provider]  Magnesium Citrate 125 MG CAPS Take 125 mg by mouth daily.    [provider]  mirtazapine  (REMERON  SOL-TAB) 15 MG disintegrating tablet Take 0.5 tablets (7.5 mg total) by mouth at bedtime. 11/06/23 11/05/24  Hadassah Letters, MD  SM MELATONIN PO Take 250 mg by mouth daily.    [provider]  traZODone  (DESYREL ) 50 MG tablet Take 0.5-1 tablets (25-50 mg total) by mouth 2 (two) times daily. 11/06/23 11/05/24  Hadassah Letters, MD    Physical Exam   Triage Vital Signs: ED Triage Vitals [12/03/23 0137]  Encounter Vitals Group     BP (!) 160/87     Systolic BP Percentile      Diastolic BP Percentile      Pulse Rate (!) 126     Resp (!) 28     Temp (!) 102.6 F (39.2 C)     Temp Source Rectal     SpO2 95 %     Weight      Height      Head Circumference      Peak Flow      Pain Score      Pain Loc      Pain Education  Exclude from Growth Chart     Most recent vital signs: Vitals:   12/03/23 0330 12/03/23 0400  BP: (!) 160/114 (!) 166/81  Pulse: (!) 120 (!) 106  Resp: 17 12  Temp:    SpO2: 93% 93%    CONSTITUTIONAL: Alert, does not answer questions or follow commands HEAD: Normocephalic, atraumatic EYES: Conjunctivae clear, pupils appear equal, sclera nonicteric ENT: normal nose; moist mucous membranes NECK: Supple, normal ROM CARD: Regular and tachycardic; S1 and S2 appreciated RESP: Patient is slightly tachypneic, breath sounds clear and equal bilaterally; no wheezes, no rhonchi, no rales, no hypoxia or respiratory distress, speaking full sentences ABD/GI: Non-distended; soft, non-tender, no rebound, no guarding, no peritoneal signs BACK: The back appears normal EXT: Normal ROM in all joints; no deformity noted, no edema SKIN: Normal color for age and race; warm; no rash on exposed skin NEURO: Patient has a right gaze preference and will not look past midline, rhythmic lip and tongue smacking, no abnormal  movement of her extremities    ED Results / Procedures / Treatments   LABS: (all labs ordered are listed, but only abnormal results are displayed) Labs Reviewed  LACTIC ACID, PLASMA - Abnormal; Notable for the following components:      Result Value   Lactic Acid, Venous 2.8 (*)    All other components within normal limits  COMPREHENSIVE METABOLIC PANEL WITH GFR - Abnormal; Notable for the following components:   Sodium 146 (*)    Glucose, Bld 137 (*)    BUN 37 (*)    Creatinine, Ser 1.10 (*)    Calcium 8.6 (*)    Total Protein 6.3 (*)    Total Bilirubin 1.5 (*)    GFR, Estimated 47 (*)    All other components within normal limits  CBC WITH DIFFERENTIAL/PLATELET - Abnormal; Notable for the following components:   WBC 2.0 (*)    RBC 3.74 (*)    Hemoglobin 11.8 (*)    Platelets 107 (*)    Lymphs Abs 0.2 (*)    Monocytes Absolute 0.0 (*)    All other components within normal limits  PROTIME-INR - Abnormal; Notable for the following components:   Prothrombin Time 16.1 (*)    INR 1.3 (*)    All other components within normal limits  URINALYSIS, W/ REFLEX TO CULTURE (INFECTION SUSPECTED) - Abnormal; Notable for the following components:   Color, Urine STRAW (*)    APPearance HAZY (*)    Hgb urine dipstick LARGE (*)    Protein, ur 100 (*)    Leukocytes,Ua LARGE (*)    Bacteria, UA MANY (*)    All other components within normal limits  RESP PANEL BY RT-PCR (RSV, FLU A&B, COVID)  [id number]  CULTURE, BLOOD (ROUTINE X 2)  CULTURE, BLOOD (ROUTINE X 2)  URINE CULTURE  PROCALCITONIN  LACTIC ACID, PLASMA  CBG MONITORING, ED     EKG:  EKG Interpretation Date/Time:  Monday December 03 2023 02:03:02 EDT Ventricular Rate:  124 PR Interval:  152 QRS Duration:  68 QT Interval:  316 QTC Calculation: 453 R Axis:   -36  Text Interpretation: Sinus tachycardia Left axis deviation Possible Anterior infarct , age undetermined Abnormal ECG When compared with ECG of 15-Nov-2021 17:36,  PREVIOUS ECG IS PRESENT Confirmed by Verneda Golder [id number]) on 12/03/2023 2:09:49 AM         RADIOLOGY: My personal review and interpretation of imaging: CT head shows no acute abnormality.  Chest x-ray clear.  I have  personally reviewed all radiology reports.   DG Chest Port 1 View Result Date: 12/03/2023 CLINICAL DATA:  Tremors. EXAM: PORTABLE CHEST 1 VIEW COMPARISON:  November 15, 2021 FINDINGS: The cardiac silhouette is enlarged and unchanged in size. Stable right-sided tracheal deviation is noted which may be secondary to an enlarged thyroid gland. There is no evidence of acute infiltrate, pleural effusion or pneumothorax. Multilevel degenerative changes are seen throughout the thoracic spine. IMPRESSION: Stable cardiomegaly, without acute or active cardiopulmonary disease. Electronically Signed   By: Virgle Grime M.D.   On: 12/03/2023 03:31   CT HEAD WO CONTRAST ( ) Result Date: 12/03/2023 CLINICAL DATA:  Seizure, new-onset, no history of trauma EXAM: CT HEAD WITHOUT CONTRAST TECHNIQUE: Contiguous axial images were obtained from the base of the skull through the vertex without intravenous contrast. RADIATION DOSE REDUCTION: This exam was performed according to the departmental dose-optimization program which includes automated exposure control, adjustment of the mA and/or kV according to patient size and/or use of iterative reconstruction technique. COMPARISON:  CT head April 4, 23. FINDINGS: Brain: No evidence of acute infarction, hemorrhage, hydrocephalus, extra-axial collection or mass lesion/mass effect. Similar remote right occipital infarct. Similar patchy white matter hypodensities, compatible with chronic microvascular ischemic disease. Cerebral atrophy. Vascular: No hyperdense vessel. Skull: No acute fracture. Sinuses/Orbits: Clear sinuses.  No acute findings. IMPRESSION: 1. No evidence of acute intracranial abnormality. 2. Similar remote right occipital infarct and chronic  microvascular ischemic disease. Electronically Signed   By: Stevenson Elbe M.D.   On: 12/03/2023 03:07     PROCEDURES:  Critical Care performed: Yes, see critical care procedure note(s)   CRITICAL CARE Performed by: Starling Eck Osbaldo Mark   Total critical care time: 40 minutes  Critical care time was exclusive of separately billable procedures and treating other patients.  Critical care was necessary to treat or prevent imminent or life-threatening deterioration.  Critical care was time spent personally by me on the following activities: development of treatment plan with patient and/or surrogate as well as nursing, discussions with consultants, evaluation of patient's response to treatment, examination of patient, obtaining history from patient or surrogate, ordering and performing treatments and interventions, ordering and review of laboratory studies, ordering and review of radiographic studies, pulse oximetry and re-evaluation of patient's condition.   Aaron Aas1-3 Lead EKG Interpretation  Performed by: Skylinn Vialpando, Clover Dao, DO Authorized by: Kyion Gautier, Clover Dao, DO     Interpretation: abnormal     ECG rate:  125   ECG rate assessment: tachycardic     Rhythm: sinus tachycardia     Ectopy: none     Conduction: normal       IMPRESSION / MDM / ASSESSMENT AND PLAN / ED COURSE  I reviewed the triage vital signs and the nursing notes.    Patient here for fever, tachycardia, tachypnea.  Also having possible seizure-like activity.  The patient is on the cardiac monitor to evaluate for evidence of arrhythmia and/or significant heart rate changes.   DIFFERENTIAL DIAGNOSIS (includes but not limited to):   Sepsis, UTI, pneumonia, meningitis, encephalitis, stroke, intracranial hemorrhage, seizure, electrolyte derangement   Patient's presentation is most consistent with acute presentation with potential threat to life or bodily function.   PLAN: Will obtain labs, cultures, urine, chest x-ray, head  CT.  Will give IV fluids, broad-spectrum antibiotics.  Will give Ativan , Keppra .  Family at bedside states patient is a full code.   MEDICATIONS GIVEN IN ED: Medications  lactated ringers  infusion ( Intravenous New Bag/Given 12/03/23 0347)  vancomycin  (VANCOCIN ) IVPB 1000 mg/200 mL premix (1,000 mg Intravenous New Bag/Given 12/03/23 0343)  LORazepam  (ATIVAN ) injection 1 mg (1 mg Intravenous Given 12/03/23 0156)  levETIRAcetam  (KEPPRA ) IVPB 1000 mg/100 mL premix (0 mg Intravenous Stopped 12/03/23 0245)  lactated ringers  bolus 1,000 mL (0 mLs Intravenous Stopped 12/03/23 0314)    And  lactated ringers  bolus 1,000 mL (0 mLs Intravenous Stopped 12/03/23 0343)  ceFEPIme  (MAXIPIME ) 2 g in sodium chloride  0.9 % 100 mL IVPB (0 g Intravenous Stopped 12/03/23 0248)  metroNIDAZOLE  (FLAGYL ) IVPB 500 mg (0 mg Intravenous Stopped 12/03/23 0351)  acetaminophen  (OFIRMEV ) IV 1,000 mg (0 mg Intravenous Stopped 12/03/23 0338)     ED COURSE: Patient now resting.  No further abnormal movements.  Eyes are now midline.  Doubt status epilepticus.  Labs show leukopenia with white count of 2.0, lactic of 2.8, procalcitonin greater than 5.  She continues to be tachycardic but tachypnea has improved.  She is still hypertensive.  CT head, chest x-ray reviewed and interpreted by myself and the radiologist show no acute abnormality.  Urine appears grossly infected which is likely the source of her sepsis today.  Culture pending.  No prior positive urine cultures within our system for comparison.  Will discuss with the hospitalist for admission.   CONSULTS:  Consulted and discussed patient's case with hospitalist, Dr. Alva Jewels.  I have recommended admission and consulting physician agrees and will place admission orders.  Patient (and family if present) agree with this plan.   I reviewed all nursing notes, vitals, pertinent previous records.  All labs, EKGs, imaging ordered have been independently reviewed and interpreted by  myself.    OUTSIDE RECORDS REVIEWED: Reviewed last family medicine note on 11/06/2023.       FINAL CLINICAL IMPRESSION(S) / ED DIAGNOSES   Final diagnoses:  Acute sepsis (HCC)  Acute UTI  Seizure-like activity (HCC)     Rx / DC Orders   ED Discharge Orders     None        Note:  This document was prepared using Dragon voice recognition software and may include unintentional dictation errors.   Shereece Wellborn, Clover Dao, DO 12/03/23 [phone]

## 2023-12-03 NOTE — Progress Notes (Signed)
 CODE SEPSIS - PHARMACY COMMUNICATION  **Broad Spectrum Antibiotics should be administered within 1 hour of Sepsis diagnosis**  Time Code Sepsis Called/Page Received: 0142  Antibiotics Ordered: Cefepime , Flagyl , Vancomycin   Time of 1st antibiotic administration: 0203  Coretta Dexter, PharmD, [facility] 12/03/2023 1:56 AM

## 2023-12-04 DIAGNOSIS — N390 Urinary tract infection, site not specified: Secondary | ICD-10-CM | POA: Insufficient documentation

## 2023-12-04 DIAGNOSIS — L8990 Pressure ulcer of unspecified site, unspecified stage: Secondary | ICD-10-CM | POA: Insufficient documentation

## 2023-12-04 DIAGNOSIS — G9340 Encephalopathy, unspecified: Secondary | ICD-10-CM | POA: Insufficient documentation

## 2023-12-04 DIAGNOSIS — R7881 Bacteremia: Secondary | ICD-10-CM | POA: Diagnosis not present

## 2023-12-04 DIAGNOSIS — B9620 Unspecified Escherichia coli [E. coli] as the cause of diseases classified elsewhere: Secondary | ICD-10-CM

## 2023-12-04 DIAGNOSIS — R6520 Severe sepsis without septic shock: Secondary | ICD-10-CM | POA: Diagnosis not present

## 2023-12-04 LAB — CBC
HCT: 33.8 % — ABNORMAL LOW (ref 36.0–46.0)
Hemoglobin: 11.5 g/dL — ABNORMAL LOW (ref 12.0–15.0)
MCH: 31.3 pg (ref 26.0–34.0)
MCHC: 34 g/dL (ref 30.0–36.0)
MCV: 92.1 fL (ref 80.0–100.0)
Platelets: 89 10*3/uL — ABNORMAL LOW (ref 150–400)
RBC: 3.67 MIL/uL — ABNORMAL LOW (ref 3.87–5.11)
RDW: 13.3 % (ref 11.5–15.5)
WBC: 14.3 10*3/uL — ABNORMAL HIGH (ref 4.0–10.5)
nRBC: 0 % (ref 0.0–0.2)

## 2023-12-04 LAB — BASIC METABOLIC PANEL WITH GFR
Anion gap: 12 (ref 5–15)
BUN: 16 mg/dL (ref 8–23)
CO2: 25 mmol/L (ref 22–32)
Calcium: 8.6 mg/dL — ABNORMAL LOW (ref 8.9–10.3)
Chloride: 104 mmol/L (ref 98–111)
Creatinine, Ser: 0.62 mg/dL (ref 0.44–1.00)
GFR, Estimated: >60 mL/min (ref >60)
Glucose, Bld: 88 mg/dL (ref 70–99)
Potassium: 3.1 mmol/L — ABNORMAL LOW (ref 3.5–5.1)
Sodium: 141 mmol/L (ref 135–145)

## 2023-12-04 LAB — GLUCOSE, CAPILLARY: Glucose-Capillary: 76 mg/dL (ref 70–99)

## 2023-12-04 MED ORDER — AMLODIPINE BESYLATE 5 MG PO TABS
2.5000 mg | ORAL_TABLET | Freq: Every day | ORAL | Status: DC
  Administered 2023-12-04 – 2023-12-05 (×2): 2.5 mg via ORAL
  Filled 2023-12-04 (×3): qty 1

## 2023-12-04 MED ORDER — ENSURE ENLIVE PO LIQD
237.0000 mL | Freq: Two times a day (BID) | ORAL | Status: DC
  Administered 2023-12-04 – 2023-12-05 (×3): 237 mL via ORAL

## 2023-12-04 MED ORDER — MIRTAZAPINE 15 MG PO TBDP
7.5000 mg | ORAL_TABLET | Freq: Every day | ORAL | Status: DC
  Administered 2023-12-05: 7.5 mg via ORAL
  Filled 2023-12-04 (×2): qty 0.5

## 2023-12-04 MED ORDER — BREXPIPRAZOLE 0.25 MG PO TABS
0.2500 mg | ORAL_TABLET | Freq: Every day | ORAL | Status: DC
  Administered 2023-12-04 – 2023-12-05 (×2): 0.25 mg via ORAL
  Filled 2023-12-04 (×3): qty 1

## 2023-12-04 MED ORDER — TRAZODONE HCL 50 MG PO TABS
25.0000 mg | ORAL_TABLET | Freq: Two times a day (BID) | ORAL | Status: DC
  Administered 2023-12-04 – 2023-12-05 (×3): 25 mg via ORAL
  Filled 2023-12-04 (×4): qty 1

## 2023-12-04 NOTE — Progress Notes (Signed)
 Speech Language Pathology Treatment: Dysphagia  Patient Details Name: Diana Acosta MRN: [id number] DOB: 05-Jan-1929 Today's Date: 12/04/2023 Time: 4098-1191 SLP Time Calculation (min) (ACUTE ONLY): 45 min  Assessment / Plan / Recommendation Clinical Impression  Pt seen for ongoing assessment of status and appropriateness for oral diet today. She is fully awake/alert, moving about in her chair requiring 100% Supervision to stay seated and making oral noises. Family present stated this is her Baseline at home. She smiled but was nonverbal otherwise. Pt on RA, afebrile.   Pt appears to present w/ functional oropharyngeal phase swallowing in setting of declined Cognitive status; Baseline Advanced Dementia. Family present stated they provide 100% care of pt at home, and pt eats a Blended diet at baseline. ANY Cognitive decline can impact her overall awareness/timing of swallow and safety during po tasks which increases risk for aspiration, choking.  Pt's risk for aspiration w/ oral intake can be reduced when following general aspiration precautions and using a modified diet consistency of Pureed foods. She requires MAX verbal/visual/tactile cues for follow through during po tasks and self-feeding(holding cup to drink-- this can reduce risk for aspiration when drinking liquids).        Pt consumed several trials of purees(4 ozs) and thin liquids via Cup(NO straw) w/ No consistent, overt clinical s/s of aspiration noted. Coughing was noted 2-3x w/ thin liquids -- Family stated this "happens at home too", and pt was being fed by another. Overall w/ purees and thin liquids, no consistent/frequent coughing and no decline in respiratory status during/post trials. Encouraged Family to have pt Help Hold Cup during her drinking of liquids to improve safety of swallowing. Also helped to position pt w/ a more head-forward position using pillows. Oral phase was functional for bolus management and oral clearing  of the boluses given.  Pt required hand over hand guidance Holding the Cup to Drink d/t the Cognitive decline. No unilateral OM weakness noted. Much Confusion apparent during tasks, some of which impacted oral awareness/prep.         In setting of Baseline Dementia and Cognitive decline and her Blended foods diet at home, recommend initiation of the dysphagia level 1(PUREED foods) moistened for ease of oral phase/flavor w/ thin liquids VIA CUP and pt to Help Hold Cup when drinking. Aspiration precautions; reduce Distractions during meals and engage pt during meals for self-feeding as able. 100% Supervision w/ all oral intake. Pills Crushed in Puree for safer swallowing as needed. MD/NSG updated.   ST services can be available if new needs arise. Recommend follow up w/ Palliative Care for GOC and education re: impact of Cognitive decline/Dementia and Aging on swallowing in general. Suspect pt is close to/at her swallowing baseline. PCP to monitor for negative sequelae of aspiration and discuss further w/ Family re: overall GOC moving forward then. Precautions posted in room, chart. Family agreed. MD updated.      HPI HPI: Diana Acosta is a 89 y.o. female with a PMH significant for Schizophrenia, Advanced Dementia, history of CVA.  At baseline, she lives with her Family including Sisters that provide 100% Supervision and Care for her.  She is ambulatory with assistance but otherwise completely dependent for ADLs.   Nonverbal with the exception of intermittent expressions.  Spanish only speaking and hard of hearing requiring hearing aid.     They presented from home to the ED on 12/03/2023 with weakness beginning 4/20.  History is obtained by account of sister who lives with  the patient and is her primary caregiver.  Sister states that throughout the course the day patient had mild weakness and decreased appetite.  Was not able to ambulate as she normally does.  Therefore, sister fed the patient her  food well the patient was laying in bed.  Denies any signs of choking or aspiration.  Overnight when patient is sleeping she typically makes a noise to indicate that she has to go to the bathroom.  Last night, she made this noise so sister attempted to get her to the commode but patient was unable to stand.  Sister states that patient felt very hot as though she had a fever but did not measure her temperature.  She also had transient tremor of her upper extremities similar to having the chills.  Patient was unable to express any concerns.  When asked if she had any pain she said no.  It did appear however that she was having difficulties with urinating throughout the day per her sister report.  No reported vomiting or diarrhea.  States that patient has never acted in this manner previously. Advanced Dementia  schizophrenia-at baseline is ambulatory with one-person assist.  Intermittently she does not have consistent verbal interactions but can occasionally answer questions.  She is not oriented to location or time.   CXR 12/03/23: Stable cardiomegaly, without acute or active cardiopulmonary  disease. Head CT No evidence of acute intracranial abnormality.  2. Similar remote right occipital infarct and chronic microvascular  ischemic disease.      SLP Plan  All goals met      Recommendations for follow up therapy are one component of a multi-disciplinary discharge planning process, led by the attending physician.  Recommendations may be updated based on patient status, additional functional criteria and insurance authorization.    Recommendations  Diet recommendations: Dysphagia 1 (puree);Thin liquid Liquids provided via: Cup;No straw Medication Administration: Crushed with puree Supervision: Staff to assist with self feeding;Full supervision/cueing for compensatory strategies Compensations: Minimize environmental distractions;Slow rate;Small sips/bites;Multiple dry swallows after each bite/sip;Follow  solids with liquid Postural Changes and/or Swallow Maneuvers: Out of bed for meals;Seated upright 90 degrees;Upright 30-60 min after meal                 (Palliative Care consult for GOC at her advanced age) Oral care BID;Oral care before and after PO;Staff/trained caregiver to provide oral care   Frequent or constant Supervision/Assistance (chronic) Dysphagia, unspecified (R13.10) (impact of Advanced Dementia)     All goals met       Darla Edward, MS, CCC-SLP Speech Language Pathologist Rehab Services; [facility] [phone] (ascom) Myreon Wimer  12/04/2023, 4:51 PM

## 2023-12-04 NOTE — Evaluation (Signed)
 Occupational Therapy Evaluation Patient Details Name: Diana Acosta MRN: [id number] DOB: 1928-11-30 Today's Date: 12/04/2023   History of Present Illness   Diana Acosta is a 89yoF who comes to [facility] on 12/03/23 with fever, rigors. Pt tachycardic, tachypnic. Pt presentation noted by sister who is HCPOA, as pt is nonverbal with dementia at baseline. Head CT revealing of "1 no evidence of acute intracranial abnormality.  2 similar remote right occipital infarct and chronic microvascular ischemic disease." PMH: dementia, schizophrenia, HTN, CVA.   Clinical Impressions [name] was seen for OT evaluation this date. Prior to hospital admission, pt required assist for all transfers and ADLs. Pt lives with 2 sisters. Pt currently requires MOD A x2 exit bed, fait static sitting balance. MOD A x2 + HHHA sit<>stand x3 and bed>chair step pivot t/f with sisters assisting. Educated on gait belt use for safety (provided). Pt would benefit from skilled OT to address noted impairments and functional limitations (see below for any additional details). Upon hospital discharge, recommend OT follow up and continuing 24/7 assistance.     If plan is discharge home, recommend the following:   Help with stairs or ramp for entrance;Supervision due to cognitive status;A lot of help with bathing/dressing/bathroom;A lot of help with walking and/or transfers     Functional Status Assessment   Patient has had a recent decline in their functional status and demonstrates the ability to make significant improvements in function in a reasonable and predictable amount of time.     Equipment Recommendations   BSC/3in1     Recommendations for Other Services         Precautions/Restrictions   Precautions Precautions: Fall Recall of Precautions/Restrictions: Impaired Restrictions Weight Bearing Restrictions Per Provider Order: No     Mobility Bed Mobility Overal bed mobility: Needs  Assistance Bed Mobility: Supine to Sit     Supine to sit: +2 for physical assistance, Mod assist     General bed mobility comments: sisters provide assist    Transfers Overall transfer level: Needs assistance Equipment used: 2 person hand held assist Transfers: Sit to/from Stand, Bed to chair/wheelchair/BSC Sit to Stand: +2 physical assistance, Mod assist     Step pivot transfers: Mod assist, +2 physical assistance            Balance Overall balance assessment: Needs assistance Sitting-balance support: No upper extremity supported, Feet supported Sitting balance-Leahy Scale: Fair     Standing balance support: Bilateral upper extremity supported Standing balance-Leahy Scale: Poor                             ADL either performed or assessed with clinical judgement   ADL Overall ADL's : Needs assistance/impaired                                       General ADL Comments: MOD A x2 for simulated BSC t/f      Pertinent Vitals/Pain Pain Assessment Pain Assessment: No/denies pain     Extremity/Trunk Assessment Upper Extremity Assessment Upper Extremity Assessment: Generalized weakness   Lower Extremity Assessment Lower Extremity Assessment: Generalized weakness       Communication Communication Communication: Impaired Factors Affecting Communication: Hearing impaired;Difficulty expressing self   Cognition Arousal: Alert Behavior During Therapy: WFL for tasks assessed/performed Cognition: History of cognitive impairments  Following commands: Intact       Cueing  General Comments   Cueing Techniques: Verbal cues;Tactile cues              Home Living Family/patient expects to be discharged to:: Private residence Living Arrangements: Other (Comment) (2 sisters) Available Help at Discharge: Family Type of Home: House Home Access: Stairs to enter Secretary/administrator of  Steps: 3   Home Layout: One level               Home Equipment: Rollator (4 wheels);Wheelchair - Sport and exercise psychologist Comments: hospital bed in living room      Prior Functioning/Environment Prior Level of Function : Needs assist             Mobility Comments: sister reports hands on assist for all transfers, walks household distances with assist ADLs Comments: partially self-feeds but largely dependent care    OT Problem List: Decreased strength;Decreased activity tolerance   OT Treatment/Interventions: Self-care/ADL training;Therapeutic exercise;Energy conservation;DME and/or AE instruction;Therapeutic activities;Patient/family education      OT Goals(Current goals can be found in the care plan section)   Acute Rehab OT Goals Patient Stated Goal: to go home OT Goal Formulation: With family Time For Goal Achievement: 12/18/23 Potential to Achieve Goals: Fair ADL Goals Pt Will Perform Eating: with min assist;sitting Pt Will Transfer to Toilet: with min assist;stand pivot transfer;bedside commode Pt Will Perform Toileting - Clothing Manipulation and hygiene: with mod assist;sit to/from stand   OT Frequency:  Min 1X/week    Co-evaluation              AM-PAC OT "6 Clicks" Daily Activity     Outcome Measure Help from another person eating meals?: A Lot Help from another person taking care of personal grooming?: A Lot Help from another person toileting, which includes using toliet, bedpan, or urinal?: A Lot Help from another person bathing (including washing, rinsing, drying)?: A Lot Help from another person to put on and taking off regular upper body clothing?: A Lot Help from another person to put on and taking off regular lower body clothing?: A Lot 6 Click Score: 12   End of Session Equipment Utilized During Treatment: Gait belt Nurse Communication: Mobility status  Activity Tolerance: Patient tolerated treatment well Patient left: in  chair;with call bell/phone within reach;with chair alarm set;with family/visitor present  OT Visit Diagnosis: Other abnormalities of gait and mobility (R26.89);Muscle weakness (generalized) (M62.81)                Time: 4403-4742 OT Time Calculation (min): 16 min Charges:  OT General Charges $OT Visit: 1 Visit OT Evaluation $OT Eval Low Complexity: 1 Low  Gordan Latina, M.S. OTR/L  12/04/23, 1:27 PM  ascom [phone]

## 2023-12-04 NOTE — Plan of Care (Signed)

## 2023-12-04 NOTE — Progress Notes (Signed)
 Progress Note   Patient: Diana Acosta JYN:[id number] DOB: [birth year] DOA: 12/03/2023     1 DOS: the patient was seen and examined on 12/04/2023   Brief hospital course: Emelly Wurtz is a 89 y.o. female with a PMH significant for schizophrenia, advanced dementia, history of CVA presented for worsening weakness, decreased appetite, tremors.  She was noted to have a fever of 102.6, elevated heart rate, respiratory rate admitted for sepsis secondary to urologic origin.  Assessment and Plan: Severe sepsis E. coli bacteremia Urine, blood cultures positive for E. coli.  Susceptibilities to follow. Tachycardia improved, still tachypneic, blood pressures actually elevated.  Lactic acid trended down.  White count initially 2.0 today is 14.3.  Hemoglobin stable. Patient will be continued on Rocephin  therapy. Gentle IV hydration if she is unable to eat. Encourage oral diet, supplements as per SLP.  Acute metabolic encephalopathy: Patient was very confused, restless upon presentation. Yesterday she was more sleepy and lethargic.  But today she is more alert and awake.  Family at bedside states she is almost back to baseline. Will continue to follow neurochecks. Fall, aspiration and seizure precaution. Delirium precautions.  Advanced dementia Schizophrenia Noninteractive at baseline, family at bedside provide history. Swallow evaluation. Resume home medications once able to tolerate p.o.  Hypertension: Patient's blood pressure creeping up. Will restart amlodipine  as needed. Caution with hypotension.         Out of bed to chair. Incentive spirometry. Nursing supportive care. Fall, aspiration precautions. Diet:  Diet Orders (From admission, onward)     Start     Ordered   12/04/23 1230  DIET - DYS 1 Room service appropriate? No; Fluid consistency: Thin  Diet effective now       Comments: Cream Soups at lunch/dinner meals.  Gravies on mashed potatoes.  Butter too.   NO Straws!! Yogurts, puddings tid meals.  May have Oatmeal per Speech ok w/ butter/sugar.  Question Answer Comment  Room service appropriate? No   Fluid consistency: Thin      12/04/23 1231           DVT prophylaxis: heparin  injection 5,000 Units Start: 12/03/23 0600  Level of care: Progressive   Code Status: Full Code  Subjective: Patient is seen and examined today morning.  She opened eyes, unable to interact.  Sisters at bedside states she is improving.  Eating poor.  Did not get out of bed.  Physical Exam: Vitals:   12/04/23 0808 12/04/23 0906 12/04/23 0907 12/04/23 1236  BP: (!) 143/47 (!) 171/76 (!) 155/80 115/68  Pulse: 69 (!) 57 68 73  Resp: 18 16  20   Temp: 98.1 F (36.7 C) 98 F (36.7 C)  98.4 F (36.9 C)  TempSrc:      SpO2: 100% 98%  96%  Weight:      Height:        General - Elderly ill Caucasian thin built female, not interacting, no acute distress HEENT - PERRLA, EOMI, atraumatic head, non tender sinuses. Lung - Clear, basal rales, rhonchi, no wheezes. Heart - S1, S2 heard, no murmurs, rubs, trace pedal edema. Abdomen - Soft, non tender, bowel sounds good Neuro -unable to interact, unable to do full neuro exam. Skin - Warm and dry.  Data Reviewed:      Latest Ref Rng & Units 12/04/2023    5:49 AM 12/03/2023    5:30 AM 12/03/2023    1:41 AM  CBC  WBC 4.0 - 10.5 K/uL 14.3  9.4  2.0   Hemoglobin 12.0 - 15.0 g/dL 16.1  9.1  09.6   Hematocrit 36.0 - 46.0 % 33.8  28.2  36.4   Platelets 150 - 400 K/uL 89  76  107       Latest Ref Rng & Units 12/04/2023    5:49 AM 12/03/2023    5:30 AM 12/03/2023    1:41 AM  BMP  Glucose 70 - 99 mg/dL 88   045   BUN 8 - 23 mg/dL 16   37   Creatinine 4.09 - 1.00 mg/dL 8.11  9.14  7.82   Sodium 135 - 145 mmol/L 141   146   Potassium 3.5 - 5.1 mmol/L 3.1   4.2   Chloride 98 - 111 mmol/L 104   109   CO2 22 - 32 mmol/L 25   26   Calcium 8.9 - 10.3 mg/dL 8.6   8.6    DG Chest Port 1 View Result Date:  12/03/2023 CLINICAL DATA:  Tremors. EXAM: PORTABLE CHEST 1 VIEW COMPARISON:  November 15, 2021 FINDINGS: The cardiac silhouette is enlarged and unchanged in size. Stable right-sided tracheal deviation is noted which may be secondary to an enlarged thyroid gland. There is no evidence of acute infiltrate, pleural effusion or pneumothorax. Multilevel degenerative changes are seen throughout the thoracic spine. IMPRESSION: Stable cardiomegaly, without acute or active cardiopulmonary disease. Electronically Signed   By: Virgle Grime M.D.   On: 12/03/2023 03:31   CT HEAD WO CONTRAST ( ) Result Date: 12/03/2023 CLINICAL DATA:  Seizure, new-onset, no history of trauma EXAM: CT HEAD WITHOUT CONTRAST TECHNIQUE: Contiguous axial images were obtained from the base of the skull through the vertex without intravenous contrast. RADIATION DOSE REDUCTION: This exam was performed according to the departmental dose-optimization program which includes automated exposure control, adjustment of the mA and/or kV according to patient size and/or use of iterative reconstruction technique. COMPARISON:  CT head April 4, 23. FINDINGS: Brain: No evidence of acute infarction, hemorrhage, hydrocephalus, extra-axial collection or mass lesion/mass effect. Similar remote right occipital infarct. Similar patchy white matter hypodensities, compatible with chronic microvascular ischemic disease. Cerebral atrophy. Vascular: No hyperdense vessel. Skull: No acute fracture. Sinuses/Orbits: Clear sinuses.  No acute findings. IMPRESSION: 1. No evidence of acute intracranial abnormality. 2. Similar remote right occipital infarct and chronic microvascular ischemic disease. Electronically Signed   By: Stevenson Elbe M.D.   On: 12/03/2023 03:07    Family Communication: Discussed with patient's family at bedside, they understand and agree. All questions answered.  Disposition: Status is: Inpatient Remains inpatient appropriate because: bacteremia,  IV antibiotics  Planned Discharge Destination: Home with Home Health     Time spent: 42 minutes  Author: Aisha Hove, MD 12/04/2023 3:00 PM Secure chat 7am to 7pm For on call review [email or web address].

## 2023-12-05 DIAGNOSIS — R5381 Other malaise: Secondary | ICD-10-CM | POA: Insufficient documentation

## 2023-12-05 DIAGNOSIS — R6520 Severe sepsis without septic shock: Secondary | ICD-10-CM | POA: Diagnosis not present

## 2023-12-05 DIAGNOSIS — N390 Urinary tract infection, site not specified: Secondary | ICD-10-CM | POA: Diagnosis not present

## 2023-12-05 DIAGNOSIS — R7881 Bacteremia: Secondary | ICD-10-CM | POA: Diagnosis not present

## 2023-12-05 DIAGNOSIS — E876 Hypokalemia: Secondary | ICD-10-CM | POA: Insufficient documentation

## 2023-12-05 DIAGNOSIS — G9340 Encephalopathy, unspecified: Secondary | ICD-10-CM | POA: Diagnosis not present

## 2023-12-05 LAB — CBC
HCT: 38.9 % (ref 36.0–46.0)
Hemoglobin: 13.2 g/dL (ref 12.0–15.0)
MCH: 31.1 pg (ref 26.0–34.0)
MCHC: 33.9 g/dL (ref 30.0–36.0)
MCV: 91.5 fL (ref 80.0–100.0)
Platelets: 108 10*3/uL — ABNORMAL LOW (ref 150–400)
RBC: 4.25 MIL/uL (ref 3.87–5.11)
RDW: 13.2 % (ref 11.5–15.5)
WBC: 10.7 10*3/uL — ABNORMAL HIGH (ref 4.0–10.5)
nRBC: 0 % (ref 0.0–0.2)

## 2023-12-05 LAB — CULTURE, BLOOD (ROUTINE X 2)

## 2023-12-05 LAB — URINE CULTURE: Culture: >=100000 — AB

## 2023-12-05 LAB — BASIC METABOLIC PANEL WITH GFR
Anion gap: 9 (ref 5–15)
BUN: 23 mg/dL (ref 8–23)
CO2: 27 mmol/L (ref 22–32)
Calcium: 8.6 mg/dL — ABNORMAL LOW (ref 8.9–10.3)
Chloride: 104 mmol/L (ref 98–111)
Creatinine, Ser: 0.83 mg/dL (ref 0.44–1.00)
GFR, Estimated: >60 mL/min (ref >60)
Glucose, Bld: 101 mg/dL — ABNORMAL HIGH (ref 70–99)
Potassium: 3 mmol/L — ABNORMAL LOW (ref 3.5–5.1)
Sodium: 140 mmol/L (ref 135–145)

## 2023-12-05 MED ORDER — LORAZEPAM 0.5 MG PO TABS
0.5000 mg | ORAL_TABLET | Freq: Once | ORAL | Status: AC
  Administered 2023-12-05: 0.5 mg via ORAL
  Filled 2023-12-05: qty 1

## 2023-12-05 MED ORDER — POTASSIUM CHLORIDE 20 MEQ PO PACK
40.0000 meq | PACK | Freq: Two times a day (BID) | ORAL | Status: DC
  Administered 2023-12-05 – 2023-12-06 (×3): 40 meq via ORAL
  Filled 2023-12-05 (×4): qty 2

## 2023-12-05 MED ORDER — HALOPERIDOL LACTATE 5 MG/ML IJ SOLN: 2.0000 mg | Freq: Four times a day (QID) | INTRAMUSCULAR | Status: DC | PRN

## 2023-12-05 MED ORDER — AMLODIPINE BESYLATE 5 MG PO TABS
5.0000 mg | ORAL_TABLET | Freq: Every day | ORAL | Status: DC
  Administered 2023-12-06: 5 mg via ORAL
  Filled 2023-12-05 (×2): qty 1

## 2023-12-05 NOTE — Plan of Care (Signed)

## 2023-12-05 NOTE — Progress Notes (Signed)
 Progress Note   Patient: Diana Acosta UJW:[id number] DOB: 1928/09/25 DOA: 12/03/2023     2 DOS: the patient was seen and examined on 12/05/2023   Brief hospital course: Diana Acosta is a 89 y.o. female with a PMH significant for schizophrenia, advanced dementia, history of CVA presented for worsening weakness, decreased appetite, tremors.  She was noted to have a fever of 102.6, elevated heart rate, respiratory rate admitted for sepsis secondary to urologic origin.  Assessment and Plan: Severe sepsis E. coli bacteremia Urine, blood cultures positive for E. coli.  Susceptibilities reviewed. Tachycardia, tachypnea, BP improved. Lactic acid trended down.  White count initially 2.0 today is 10.  Hemoglobin stable. Continue Rocephin  therapy. Stop IV hydration. Encourage oral diet, supplements.  Acute metabolic encephalopathy: She is more alert and awake, Not interactive at baseline per sister at bedside. Will continue to follow neurochecks. Fall, aspiration and seizure precaution. Delirium precautions.  Advanced dementia Schizophrenia Noninteractive at baseline, weaker than usual. Eating poor. Passed swallow evaluation. Continue dysphagia diet. Resumed home medications.  Hypokalemia- Oral potassium supplements ordered. Monitor daily electrolytes.  Hypertension: Patient's blood pressure elevated. Increased amlodipine  dose to 5mg  daily.  Debility- Decline in functional status. Worked with PT/ OT. She is requiring lot of help. Will need HH services, DME as appropriate.      Out of bed to chair. Incentive spirometry. Nursing supportive care. Fall, aspiration precautions. Diet:  Diet Orders (From admission, onward)     Start     Ordered   12/04/23 1230  DIET - DYS 1 Room service appropriate? No; Fluid consistency: Thin  Diet effective now       Comments: Cream Soups at lunch/dinner meals.  Gravies on mashed potatoes.  Butter too.  NO Straws!! Yogurts,  puddings tid meals.  May have Oatmeal per Speech ok w/ butter/sugar.  Question Answer Comment  Room service appropriate? No   Fluid consistency: Thin      12/04/23 1231           DVT prophylaxis: heparin  injection 5,000 Units Start: 12/03/23 0600  Level of care: Progressive   Code Status: Full Code  Subjective: Patient is seen and examined today morning.  She opened eyes, seems at baseline.  Sister at bedside feeding her.  Eating poor.  Did work with PT and sat for some time yesterday.  Physical Exam: Vitals:   12/04/23 1621 12/04/23 2044 12/05/23 0017 12/05/23 0828  BP:  (!) 170/90 (!) 164/82 (!) 143/73  Pulse: 71 91 91 65  Resp:   17 16  Temp:  98.9 F (37.2 C) 98.6 F (37 C) 97.9 F (36.6 C)  TempSrc:  Oral  Oral  SpO2: 98% 99% 100% 99%  Weight:      Height:        General - Elderly ill Caucasian thin built female, no apparent distress HEENT - PERRLA, EOMI, atraumatic head, non tender sinuses. Lung - Clear, basal rales, rhonchi, no wheezes. Heart - S1, S2 heard, no murmurs, rubs, trace pedal edema. Abdomen - Soft, non tender, bowel sounds good Neuro -unable to interact, unable to do full neuro exam. Skin - Warm and dry.  Data Reviewed:      Latest Ref Rng & Units 12/05/2023    5:50 AM 12/04/2023    5:49 AM 12/03/2023    5:30 AM  CBC  WBC 4.0 - 10.5 K/uL 10.7  14.3  9.4   Hemoglobin 12.0 - 15.0 g/dL 56.2  13.0  9.1  Hematocrit 36.0 - 46.0 % 38.9  33.8  28.2   Platelets 150 - 400 K/uL 108  89  76       Latest Ref Rng & Units 12/05/2023    5:50 AM 12/04/2023    5:49 AM 12/03/2023    5:30 AM  BMP  Glucose 70 - 99 mg/dL 130  88    BUN 8 - 23 mg/dL 23  16    Creatinine 8.65 - 1.00 mg/dL 7.84  6.96  2.95   Sodium 135 - 145 mmol/L 140  141    Potassium 3.5 - 5.1 mmol/L 3.0  3.1    Chloride 98 - 111 mmol/L 104  104    CO2 22 - 32 mmol/L 27  25    Calcium 8.9 - 10.3 mg/dL 8.6  8.6     No results found.   Family Communication: Discussed with patient's  sister at bedside, she understand and agree. All questions answered.  Disposition: Status is: Inpatient Remains inpatient appropriate because: bacteremia, IV antibiotics  Planned Discharge Destination: Home with Home Health     Time spent: 40 minutes  Author: Aisha Hove, MD 12/05/2023 11:59 AM Secure chat 7am to 7pm For on call review [email or web address].

## 2023-12-05 NOTE — TOC Progression Note (Signed)
 Transition of Care [facility]) - Progression Note    Patient Details  Name: Diana Acosta MRN: [id number] Date of Birth: 06-29-1929  [facility] CM/SW Contact  Baird Bombard, RN Phone Number: 12/05/2023, 4:18 PM  Clinical Narrative:    Spoke with patients family at bedside regarding therapy's recommendation for SNF. They stated family takes care of patient around the clock but would like time to think about SNF.         Expected Discharge Plan and Services                                               Social Determinants of Health (SDOH) Interventions SDOH Screenings   Food Insecurity: No Food Insecurity (12/03/2023)  Housing: Low Risk  (12/03/2023)  Transportation Needs: No Transportation Needs (12/03/2023)  Utilities: Not At Risk (12/03/2023)  Depression (PHQ2-9): High Risk (05/09/2023)  Social Connections: Socially Isolated (12/03/2023)  Tobacco Use: Low Risk  (12/03/2023)    Readmission Risk Interventions     No data to display

## 2023-12-05 NOTE — Progress Notes (Signed)
 PT Cancellation Note  Patient Details Name: Diana Acosta MRN: [id number] DOB: Apr 14, 1929   Cancelled Treatment:    Reason Eval/Treat Not Completed: PT screened, no needs identified, will sign off (Confirmed with OT DC needs and preferences of caregivers. Also confirmed pt mobilizing with caregivers/staff. No additional recommendations to offer from PT standpoint. Will sign off at this time.)  8:22 AM, 12/05/23 Dawn Eth, PT, DPT Physical Therapist - [facility]  [phone] (ASCOM)    Maniah Nading C 12/05/2023, 8:22 AM

## 2023-12-06 ENCOUNTER — Other Ambulatory Visit: Payer: Self-pay

## 2023-12-06 DIAGNOSIS — G9340 Encephalopathy, unspecified: Secondary | ICD-10-CM | POA: Diagnosis not present

## 2023-12-06 DIAGNOSIS — R5381 Other malaise: Secondary | ICD-10-CM

## 2023-12-06 DIAGNOSIS — R7881 Bacteremia: Secondary | ICD-10-CM | POA: Diagnosis not present

## 2023-12-06 DIAGNOSIS — E43 Unspecified severe protein-calorie malnutrition: Secondary | ICD-10-CM

## 2023-12-06 DIAGNOSIS — N390 Urinary tract infection, site not specified: Secondary | ICD-10-CM | POA: Diagnosis not present

## 2023-12-06 DIAGNOSIS — E876 Hypokalemia: Secondary | ICD-10-CM

## 2023-12-06 LAB — BASIC METABOLIC PANEL WITH GFR
Anion gap: 10 (ref 5–15)
BUN: 35 mg/dL — ABNORMAL HIGH (ref 8–23)
CO2: 27 mmol/L (ref 22–32)
Calcium: 8.8 mg/dL — ABNORMAL LOW (ref 8.9–10.3)
Chloride: 107 mmol/L (ref 98–111)
Creatinine, Ser: 1.13 mg/dL — ABNORMAL HIGH (ref 0.44–1.00)
GFR, Estimated: 46 mL/min — ABNORMAL LOW (ref >60)
Glucose, Bld: 125 mg/dL — ABNORMAL HIGH (ref 70–99)
Potassium: 3.9 mmol/L (ref 3.5–5.1)
Sodium: 144 mmol/L (ref 135–145)

## 2023-12-06 LAB — CBC
HCT: 36.7 % (ref 36.0–46.0)
Hemoglobin: 12.4 g/dL (ref 12.0–15.0)
MCH: 30.7 pg (ref 26.0–34.0)
MCHC: 33.8 g/dL (ref 30.0–36.0)
MCV: 90.8 fL (ref 80.0–100.0)
Platelets: 133 10*3/uL — ABNORMAL LOW (ref 150–400)
RBC: 4.04 MIL/uL (ref 3.87–5.11)
RDW: 13.3 % (ref 11.5–15.5)
WBC: 7.4 10*3/uL (ref 4.0–10.5)
nRBC: 0 % (ref 0.0–0.2)

## 2023-12-06 MED ORDER — ENSURE ENLIVE PO LIQD
237.0000 mL | Freq: Two times a day (BID) | ORAL | 12 refills | Status: AC
  Filled 2023-12-06: qty 237, 1d supply, fill #0

## 2023-12-06 MED ORDER — AMLODIPINE BESYLATE 2.5 MG PO TABS
2.5000 mg | ORAL_TABLET | Freq: Every day | ORAL | 2 refills | Status: DC
  Filled 2023-12-06: qty 30, 30d supply, fill #0

## 2023-12-06 MED ORDER — CEPHALEXIN 250 MG/5ML PO SUSR
250.0000 mg | Freq: Three times a day (TID) | ORAL | 0 refills | Status: DC
  Filled 2023-12-06: qty 200, 14d supply, fill #0

## 2023-12-06 NOTE — Progress Notes (Signed)
  I have reviewed and concur with this student's documentation.   Allyn Kenner , BSN, RN

## 2023-12-06 NOTE — Plan of Care (Signed)
  Problem: Fluid Volume: Goal: Hemodynamic stability will improve Outcome: Progressing   Problem: Clinical Measurements: Goal: Diagnostic test results will improve Outcome: Progressing   Problem: Clinical Measurements: Goal: Signs and symptoms of infection will decrease Outcome: Progressing   Problem: Respiratory: Goal: Ability to maintain adequate ventilation will improve Outcome: Progressing   Problem: Education: Goal: Knowledge of General Education information will improve Description: Including pain rating scale, medication(s)/side effects and non-pharmacologic comfort measures Outcome: Progressing   Problem: Health Behavior/Discharge Planning: Goal: Ability to manage health-related needs will improve Outcome: Progressing   Problem: Clinical Measurements: Goal: Ability to maintain clinical measurements within normal limits will improve Outcome: Progressing   Problem: Nutrition: Goal: Adequate nutrition will be maintained Outcome: Progressing   Problem: Coping: Goal: Level of anxiety will decrease Outcome: Progressing   Problem: Elimination: Goal: Will not experience complications related to bowel motility Outcome: Progressing   Problem: Elimination: Goal: Will not experience complications related to urinary retention Outcome: Progressing

## 2023-12-06 NOTE — Discharge Summary (Signed)
 Physician Discharge Summary   Patient: Diana Acosta MRN: [id number] DOB: 1929-08-04  Admit date:     12/03/2023  Discharge date: 12/06/23  Discharge Physician: Aisha Hove   PCP: Hadassah Letters, MD   Recommendations at discharge:    PCP follow up in 1 week  Discharge Diagnoses: Principal Problem:   Severe sepsis [facility]) Active Problems:   Pressure injury of skin   E coli bacteremia   E. coli UTI   Acute encephalopathy   Hypokalemia   Debility  Resolved Problems:   * No resolved hospital problems. *  Hospital Course: Diana Acosta is a 89 y.o. female with a PMH significant for schizophrenia, advanced dementia, history of CVA presented for worsening weakness, decreased appetite, tremors. She was noted to have a fever of 102.6, elevated heart rate, respiratory rate admitted for sepsis secondary to urologic origin.   Assessment and Plan: Severe sepsis E. coli bacteremia Urine, blood cultures positive for E. coli.  Susceptibilities reviewed. Tachycardia, tachypnea, BP improved. Lactic acid trended down.  White count initially 2.0 today is 7.  Hemoglobin stable. Rocephin  therapy changed to Keflex  for 10 days. Able to tolerate Dysphagia diet.   Acute metabolic encephalopathy: She is more alert and awake, Not interactive at baseline per sister at bedside. Fall, aspiration and seizure precaution. Delirium precautions. Family refused SNF, wish to take her home with family care.   Advanced dementia Schizophrenia Noninteractive at baseline, weaker than usual. Eating poor. Passed swallow evaluation. Continue dysphagia 1 diet. Resumed home medications.   Hypokalemia- Monitor daily electrolytes as outpatient.   Hypertension: Patient's blood pressure elevated. Not on antihypertensives. Advised to continue amlodipine  2.5mg  daily and follow with PCP.   Debility- Decline in functional status. Worked with PT/ OT.advised SNF. Family wish to take her  home, refused SNF.  Severe malnutrition Due to chronic illness, dementia. Encourage oral diet, supplements.      Consultants: none Procedures performed: none  Disposition: Home Diet recommendation:  Discharge Diet Orders (From admission, onward)     Start     Ordered   12/06/23 0000  Diet - low sodium heart healthy       Comments: Dysphagia 1 diet, no straws, thin liquids   12/06/23 1117           Dysphagia type 1 thin Liquid DISCHARGE MEDICATION: Allergies as of 12/06/2023       Reactions   Aspirin Nausea Only, Other (See Comments)   Acidity   Chocolate Flavoring Agent (non-screening) Other (See Comments)   Egg Solids, Whole Other (See Comments)        Medication List     TAKE these medications    amLODipine  2.5 MG tablet Commonly known as: NORVASC  Take 1 tablet (2.5 mg total) by mouth daily.   B-12 2500 MCG Tabs Take 2,500 mcg by mouth daily.   brexpiprazole  0.25 MG Tabs tablet Commonly known as: Rexulti  Take 1 tablet (0.25 mg total) by mouth daily.   cephALEXin  250 MG/5ML suspension Commonly known as: KEFLEX  Take 5 mLs (250 mg total) by mouth 3 (three) times daily for 10 days.   feeding supplement Liqd Take 237 mLs by mouth 2 (two) times daily between meals.   folic acid 400 MCG tablet Commonly known as: FOLVITE Take 200 mcg by mouth daily.   Glucosamine 750 MG Tabs Take 750 mg by mouth daily.   Magnesium Citrate 125 MG Caps Take 125 mg by mouth daily.   mirtazapine  15 MG disintegrating tablet  Commonly known as: REMERON  SOL-TAB Take 0.5 tablets (7.5 mg total) by mouth at bedtime.   SM MELATONIN PO Take 250 mg by mouth daily.   traZODone  50 MG tablet Commonly known as: DESYREL  Take 0.5-1 tablets (25-50 mg total) by mouth 2 (two) times daily.   Vitamin C 500 MG Caps Take 500 mg by mouth daily.   Vitamin D-3 125 MCG (5000 UT) Tabs Take 125 mcg by mouth daily.        Discharge Exam: Filed Weights   12/03/23 2014  Weight: 43  kg      12/06/2023   11:01 AM 12/06/2023    8:03 AM 12/06/2023    4:32 AM  Vitals with BMI  Systolic 159 120 161  Diastolic 84 57 51  Pulse 96 74 84    General - Elderly ill Caucasian thin built female, no apparent distress HEENT - PERRLA, EOMI, atraumatic head, non tender sinuses. Lung - Clear, basal rales, rhonchi, no wheezes. Heart - S1, S2 heard, no murmurs, rubs, trace pedal edema. Abdomen - Soft, non tender, bowel sounds good Neuro -unable to interact, unable to do full neuro exam. Skin - Warm and dry.  Condition at discharge: stable  The results of significant diagnostics from this hospitalization (including imaging, microbiology, ancillary and laboratory) are listed below for reference.   Imaging Studies: DG Chest Port 1 View Result Date: 12/03/2023 CLINICAL DATA:  Tremors. EXAM: PORTABLE CHEST 1 VIEW COMPARISON:  November 15, 2021 FINDINGS: The cardiac silhouette is enlarged and unchanged in size. Stable right-sided tracheal deviation is noted which may be secondary to an enlarged thyroid gland. There is no evidence of acute infiltrate, pleural effusion or pneumothorax. Multilevel degenerative changes are seen throughout the thoracic spine. IMPRESSION: Stable cardiomegaly, without acute or active cardiopulmonary disease. Electronically Signed   By: Virgle Grime M.D.   On: 12/03/2023 03:31   CT HEAD WO CONTRAST ( ) Result Date: 12/03/2023 CLINICAL DATA:  Seizure, new-onset, no history of trauma EXAM: CT HEAD WITHOUT CONTRAST TECHNIQUE: Contiguous axial images were obtained from the base of the skull through the vertex without intravenous contrast. RADIATION DOSE REDUCTION: This exam was performed according to the departmental dose-optimization program which includes automated exposure control, adjustment of the mA and/or kV according to patient size and/or use of iterative reconstruction technique. COMPARISON:  CT head April 4, 23. FINDINGS: Brain: No evidence of acute  infarction, hemorrhage, hydrocephalus, extra-axial collection or mass lesion/mass effect. Similar remote right occipital infarct. Similar patchy white matter hypodensities, compatible with chronic microvascular ischemic disease. Cerebral atrophy. Vascular: No hyperdense vessel. Skull: No acute fracture. Sinuses/Orbits: Clear sinuses.  No acute findings. IMPRESSION: 1. No evidence of acute intracranial abnormality. 2. Similar remote right occipital infarct and chronic microvascular ischemic disease. Electronically Signed   By: Stevenson Elbe M.D.   On: 12/03/2023 03:07    Microbiology: Results for orders placed or performed during the hospital encounter of 12/03/23  Resp panel by RT-PCR (RSV, Flu A&B, Covid) Anterior Nasal Swab     Status: None   Collection Time: 12/03/23  1:41 AM   Specimen: Anterior Nasal Swab  Result Value Ref Range Status   SARS Coronavirus 2 by RT PCR NEGATIVE NEGATIVE Final    Comment: (NOTE) SARS-CoV-2 target nucleic acids are NOT DETECTED.  The SARS-CoV-2 RNA is generally detectable in upper respiratory specimens during the acute phase of infection. The lowest concentration of SARS-CoV-2 viral copies this assay can detect is 138 copies/mL. A negative result does not preclude  SARS-Cov-2 infection and should not be used as the sole basis for treatment or other patient management decisions. A negative result may occur with  improper specimen collection/handling, submission of specimen other than nasopharyngeal swab, presence of viral mutation(s) within the areas targeted by this assay, and inadequate number of viral copies(<138 copies/mL). A negative result must be combined with clinical observations, patient history, and epidemiological information. The expected result is Negative.  Fact Sheet for Patients:  BloggerCourse.com  Fact Sheet for Healthcare Providers:  SeriousBroker.it  This test is no t yet approved  or cleared by the [place]  FDA and  has been authorized for detection and/or diagnosis of SARS-CoV-2 by FDA under an Emergency Use Authorization (EUA). This EUA will remain  in effect (meaning this test can be used) for the duration of the COVID-19 declaration under Section 564(b)(1) of the Act, 21 U.S.C.section 360bbb-3(b)(1), unless the authorization is terminated  or revoked sooner.       Influenza A by PCR NEGATIVE NEGATIVE Final   Influenza B by PCR NEGATIVE NEGATIVE Final    Comment: (NOTE) The Xpert Xpress SARS-CoV-2/FLU/RSV plus assay is intended as an aid in the diagnosis of influenza from Nasopharyngeal swab specimens and should not be used as a sole basis for treatment. Nasal washings and aspirates are unacceptable for Xpert Xpress SARS-CoV-2/FLU/RSV testing.  Fact Sheet for Patients: BloggerCourse.com  Fact Sheet for Healthcare Providers: SeriousBroker.it  This test is not yet approved or cleared by the [place]  FDA and has been authorized for detection and/or diagnosis of SARS-CoV-2 by FDA under an Emergency Use Authorization (EUA). This EUA will remain in effect (meaning this test can be used) for the duration of the COVID-19 declaration under Section 564(b)(1) of the Act, 21 U.S.C. section 360bbb-3(b)(1), unless the authorization is terminated or revoked.     Resp Syncytial Virus by PCR NEGATIVE NEGATIVE Final    Comment: (NOTE) Fact Sheet for Patients: BloggerCourse.com  Fact Sheet for Healthcare Providers: SeriousBroker.it  This test is not yet approved or cleared by the [place]  FDA and has been authorized for detection and/or diagnosis of SARS-CoV-2 by FDA under an Emergency Use Authorization (EUA). This EUA will remain in effect (meaning this test can be used) for the duration of the COVID-19 declaration under Section 564(b)(1) of the Act,  21 U.S.C. section 360bbb-3(b)(1), unless the authorization is terminated or revoked.  Performed at [facility], [address] [place], [place] [address]   Blood Culture (routine x 2)     Status: Abnormal   Collection Time: 12/03/23  1:41 AM   Specimen: BLOOD  Result Value Ref Range Status   Specimen Description BLOOD LEFT LOWER ARM  Final   Special Requests   Final    BOTTLES DRAWN AEROBIC AND ANAEROBIC Blood Culture results may not be optimal due to an inadequate volume of blood received in culture bottles   Culture  Setup Time   Final    GRAM NEGATIVE RODS IN BOTH AEROBIC AND ANAEROBIC BOTTLES Organism ID to follow CRITICAL RESULT CALLED TO, READ BACK BY AND VERIFIED WITH: RLVIRA MADUME 12/03/23 1233 MW    Culture ESCHERICHIA COLI (A)  Final   Report Status 12/05/2023 FINAL  Final   Organism ID, Bacteria ESCHERICHIA COLI  Final   Organism ID, Bacteria ESCHERICHIA COLI  Final      Susceptibility   Escherichia coli - MIC*    AMPICILLIN 4 SENSITIVE Sensitive     CEFEPIME  <=0.12 SENSITIVE Sensitive  CEFTAZIDIME <=1 SENSITIVE Sensitive     CEFTRIAXONE  <=0.25 SENSITIVE Sensitive     CIPROFLOXACIN <=0.25 SENSITIVE Sensitive     GENTAMICIN <=1 SENSITIVE Sensitive     IMIPENEM <=0.25 SENSITIVE Sensitive     TRIMETH/SULFA <=20 SENSITIVE Sensitive     AMPICILLIN/SULBACTAM <=2 SENSITIVE Sensitive     PIP/TAZO <=4 SENSITIVE Sensitive ug/mL   Escherichia coli - KIRBY BAUER*    CEFAZOLIN Value in next row Sensitive      SENSITIVEPerformed at [facility], [address], [place], [place] [address]    * ESCHERICHIA COLI    ESCHERICHIA COLI  Blood Culture (routine x 2)     Status: Abnormal   Collection Time: 12/03/23  1:41 AM   Specimen: BLOOD  Result Value Ref Range Status   Specimen Description   Final    BLOOD LEFT UPPER ARM Performed at [facility], [address] [place], [place] [address]    Special Requests   Final    BOTTLES DRAWN AEROBIC  AND ANAEROBIC Blood Culture results may not be optimal due to an inadequate volume of blood received in culture bottles Performed at [facility], [address] [place], [place] [address]    Culture  Setup Time   Final    GRAM NEGATIVE RODS IN BOTH AEROBIC AND ANAEROBIC BOTTLES CRITICAL RESULT CALLED TO, READ BACK BY AND VERIFIED WITH: ELVIRA MADUME 12/03/23 1233 MW GRAM STAIN REVIEWED-AGREE WITH RESULT DRT    Culture (A)  Final    ESCHERICHIA COLI SUSCEPTIBILITIES PERFORMED ON PREVIOUS CULTURE WITHIN THE LAST 5 DAYS. Performed at [facility], [address] [place], [place] [address]    Report Status 12/05/2023 FINAL  Final  Urine Culture     Status: Abnormal   Collection Time: 12/03/23  1:41 AM   Specimen: Urine, Random  Result Value Ref Range Status   Specimen Description   Final    URINE, RANDOM Performed at [facility], [address] [place], [place] [address]    Special Requests   Final    NONE Reflexed from [id number] Performed at [facility], [address] [place], [place] [address]    Culture >=100,000 COLONIES/mL ESCHERICHIA COLI (A)  Final   Report Status 12/05/2023 FINAL  Final   Organism ID, Bacteria ESCHERICHIA COLI (A)  Final      Susceptibility   Escherichia coli - MIC*    AMPICILLIN 4 SENSITIVE Sensitive     CEFAZOLIN <=4 SENSITIVE Sensitive     CEFEPIME  <=0.12 SENSITIVE Sensitive     CEFTRIAXONE  <=0.25 SENSITIVE Sensitive     CIPROFLOXACIN <=0.25 SENSITIVE Sensitive     GENTAMICIN <=1 SENSITIVE Sensitive     IMIPENEM 0.5 SENSITIVE Sensitive     NITROFURANTOIN <=16 SENSITIVE Sensitive     TRIMETH/SULFA <=20 SENSITIVE Sensitive     AMPICILLIN/SULBACTAM <=2 SENSITIVE Sensitive     PIP/TAZO <=4 SENSITIVE Sensitive ug/mL    * >=100,000 COLONIES/mL ESCHERICHIA COLI  Blood Culture ID Panel (Reflexed)     Status: Abnormal   Collection Time: 12/03/23  1:41 AM  Result Value Ref Range Status   Enterococcus faecalis NOT  DETECTED NOT DETECTED Final   Enterococcus Faecium NOT DETECTED NOT DETECTED Final   Listeria monocytogenes NOT DETECTED NOT DETECTED Final   Staphylococcus species NOT DETECTED NOT DETECTED Final   Staphylococcus aureus (BCID) NOT DETECTED NOT DETECTED Final   Staphylococcus epidermidis NOT DETECTED NOT DETECTED Final   Staphylococcus lugdunensis NOT DETECTED  NOT DETECTED Final   Streptococcus species NOT DETECTED NOT DETECTED Final   Streptococcus agalactiae NOT DETECTED NOT DETECTED Final   Streptococcus pneumoniae NOT DETECTED NOT DETECTED Final   Streptococcus pyogenes NOT DETECTED NOT DETECTED Final   A.calcoaceticus-baumannii NOT DETECTED NOT DETECTED Final   Bacteroides fragilis NOT DETECTED NOT DETECTED Final   Enterobacterales DETECTED (A) NOT DETECTED Final    Comment: Enterobacterales represent a large order of gram negative bacteria, not a single organism. CRITICAL RESULT CALLED TO, READ BACK BY AND VERIFIED WITH: ELVIRA MADUME 12/03/23 1233 MW    Enterobacter cloacae complex NOT DETECTED NOT DETECTED Final   Escherichia coli DETECTED (A) NOT DETECTED Final    Comment: CRITICAL RESULT CALLED TO, READ BACK BY AND VERIFIED WITH: ELVIRA MADUME 12/03/23 1233 MW    Klebsiella aerogenes NOT DETECTED NOT DETECTED Final   Klebsiella oxytoca NOT DETECTED NOT DETECTED Final   Klebsiella pneumoniae NOT DETECTED NOT DETECTED Final   Proteus species NOT DETECTED NOT DETECTED Final   Salmonella species NOT DETECTED NOT DETECTED Final   Serratia marcescens NOT DETECTED NOT DETECTED Final   Haemophilus influenzae NOT DETECTED NOT DETECTED Final   Neisseria meningitidis NOT DETECTED NOT DETECTED Final   Pseudomonas aeruginosa NOT DETECTED NOT DETECTED Final   Stenotrophomonas maltophilia NOT DETECTED NOT DETECTED Final   Candida albicans NOT DETECTED NOT DETECTED Final   Candida auris NOT DETECTED NOT DETECTED Final   Candida glabrata NOT DETECTED NOT DETECTED Final   Candida krusei NOT  DETECTED NOT DETECTED Final   Candida parapsilosis NOT DETECTED NOT DETECTED Final   Candida tropicalis NOT DETECTED NOT DETECTED Final   Cryptococcus neoformans/gattii NOT DETECTED NOT DETECTED Final   CTX-M ESBL NOT DETECTED NOT DETECTED Final   Carbapenem resistance IMP NOT DETECTED NOT DETECTED Final   Carbapenem resistance KPC NOT DETECTED NOT DETECTED Final   Carbapenem resistance NDM NOT DETECTED NOT DETECTED Final   Carbapenem resist OXA 48 LIKE NOT DETECTED NOT DETECTED Final   Carbapenem resistance VIM NOT DETECTED NOT DETECTED Final    Comment: Performed at [facility], [address] [place], [place] [address]    Labs: CBC: Recent Labs  Lab 12/03/23 0141 12/03/23 0530 12/04/23 0549 12/05/23 0550 12/06/23 0248  WBC 2.0* 9.4 14.3* 10.7* 7.4  NEUTROABS 1.7  --   --   --   --   HGB 11.8* 9.1* 11.5* 13.2 12.4  HCT 36.4 28.2* 33.8* 38.9 36.7  MCV 97.3 96.9 92.1 91.5 90.8  PLT 107* 76* 89* 108* 133*   Basic Metabolic Panel: Recent Labs  Lab 12/03/23 0141 12/03/23 0530 12/04/23 0549 12/05/23 0550 12/06/23 0248  NA 146*  --  141 140 144  K 4.2  --  3.1* 3.0* 3.9  CL 109  --  104 104 107  CO2 26  --  25 27 27   GLUCOSE 137*  --  88 101* 125*  BUN 37*  --  16 23 35*  CREATININE 1.10* 0.75 0.62 0.83 1.13*  CALCIUM 8.6*  --  8.6* 8.6* 8.8*   Liver Function Tests: Recent Labs  Lab 12/03/23 0141  AST 27  ALT 15  ALKPHOS 78  BILITOT 1.5*  PROT 6.3*  ALBUMIN 3.7   CBG: Recent Labs  Lab 12/03/23 0301 12/04/23 0806  GLUCAP 95 76    Discharge time spent: 36 minutes.  Signed: Aisha Hove, MD [facility] 12/06/2023

## 2023-12-06 NOTE — Care Management Important Message (Signed)
 Important Message  Patient Details  Name: Diana Acosta MRN: [id number] Date of Birth: 1929/02/25   Important Message Given:  Yes - Medicare IM     Anise Kerns 12/06/2023, 11:27 AM

## 2023-12-06 NOTE — TOC Transition Note (Signed)
 Transition of Care [facility]) - Discharge Note   Patient Details  Name: Diana Acosta MRN: [id number] Date of Birth: 02-Aug-1929  Transition of Care [facility]) CM/SW Contact:  Gryffin Altice C Eliyanah Elgersma, RN Phone Number: 12/06/2023, 11:30 AM   Clinical Narrative:    Spoke with patient's sister Sherleen Dirks. She stated Their family has strong support at home and patient has around the clock care. Cecilia  refused HH and  hoyer lift . She was advised if [facility] is still desired post discharge to follow up with patient's PCP. Patient's family will transport. Care team notified.    TOC signing off.           Patient Goals and CMS Choice            Discharge Placement                       Discharge Plan and Services Additional resources added to the After Visit Summary for                                       Social Drivers of Health (SDOH) Interventions SDOH Screenings   Food Insecurity: No Food Insecurity (12/03/2023)  Housing: Low Risk  (12/03/2023)  Transportation Needs: No Transportation Needs (12/03/2023)  Utilities: Not At Risk (12/03/2023)  Depression (PHQ2-9): High Risk (05/09/2023)  Social Connections: Socially Isolated (12/03/2023)  Tobacco Use: Low Risk  (12/03/2023)     Readmission Risk Interventions     No data to display

## 2023-12-12 NOTE — Telephone Encounter (Signed)
 Done

## 2023-12-13 ENCOUNTER — Telehealth: Admitting: Pediatrics

## 2023-12-13 ENCOUNTER — Encounter: Payer: Self-pay | Admitting: Pediatrics

## 2023-12-13 VITALS — BP 132/69 | HR 81 | Temp 97.9°F

## 2023-12-13 DIAGNOSIS — Z09 Encounter for follow-up examination after completed treatment for conditions other than malignant neoplasm: Secondary | ICD-10-CM

## 2023-12-13 DIAGNOSIS — F203 Undifferentiated schizophrenia: Secondary | ICD-10-CM | POA: Diagnosis not present

## 2023-12-13 DIAGNOSIS — F03911 Unspecified dementia, unspecified severity, with agitation: Secondary | ICD-10-CM | POA: Diagnosis not present

## 2023-12-13 DIAGNOSIS — I10 Essential (primary) hypertension: Secondary | ICD-10-CM

## 2023-12-13 MED ORDER — MIRTAZAPINE 15 MG PO TBDP: 7.5000 mg | ORAL_TABLET | Freq: Every day | ORAL | 3 refills | Status: DC

## 2023-12-13 MED ORDER — BREXPIPRAZOLE 0.25 MG PO TABS: 0.2500 mg | ORAL_TABLET | Freq: Every day | ORAL | 3 refills | Status: AC

## 2023-12-13 MED ORDER — TRAZODONE HCL 50 MG PO TABS: 25.0000 mg | ORAL_TABLET | Freq: Two times a day (BID) | ORAL | 3 refills | Status: AC

## 2023-12-13 NOTE — Progress Notes (Unsigned)
 Telehealth Visit  I connected with  [name] on 12/18/23 by a video enabled telemedicine application and verified that I am speaking with the correct person using two identifiers.   I discussed the limitations of evaluation and management by telemedicine. The patient expressed understanding and agreed to proceed.  Subjective:    Patient ID: Diana Acosta, female    DOB: 1928/10/15, 89 y.o.   MRN: [id number]  HPI: Diana Acosta is a 89 y.o. female  Chief Complaint  Patient presents with   Hospitalization Follow-up    Discussed the use of AI scribe software for clinical note transcription with the patient, who gave verbal consent to proceed.  History of Present Illness   Diana Acosta is a 89 year old female who presents with labile blood pressure management post-hospitalization for sepsis. She is accompanied by her niece, Vincent Greek, and another family member, Brian Campanile.  She was recently discharged from the hospital after treatment for sepsis. Initially, she appeared well but subsequently experienced episodes of weakness. Her blood pressure has been labile, with readings ranging from 100/50 to 180/70. She was restarted on amlodipine  after her blood pressure increased to the 170s and 180s following a period of hypotension. Her blood pressure readings have been variable, often influenced by her level of agitation during measurement.  She is currently on a course of cephalexin , prescribed for ten days due to the severity of her infection. Her family attributes her significant fatigue to the medication. She has three days remaining on this course. No fevers have been reported since discharge, with her highest recorded temperature at home being 99.86F. Her usual temperature ranges between 68F and 83F.  Her physical activity is limited; she primarily stays in bed and has not been able to sit at the dining table for meals. She uses a walker for minimal mobility,  primarily to and from the bathroom. Her family reports that she appears very weak and has been sleeping extensively, which they attribute to her recent hospitalization and current medication regimen.         Relevant past medical, surgical, family and social history reviewed and updated as indicated. Interim medical history since our last visit reviewed. Allergies and medications reviewed and updated.  ROS per HPI unless specifically indicated above     Objective:    BP 132/69   Pulse 81   Temp 97.9 F (36.6 C) (Skin)   Wt Readings from Last 3 Encounters:  12/03/23 94 lb 12.8 oz (43 kg)  07/31/23 92 lb (41.7 kg)  07/19/23 84 lb (38.1 kg)     Physical Exam Constitutional:      General: She is not in acute distress.    Appearance: Normal appearance.     Comments: Lying on hospital bed, appears somnolent but easily arousable  Neurological:     General: No focal deficit present.     Mental Status: She is alert. Mental status is at baseline.      LIMITED EXAM GIVEN VIDEO VISIT     Assessment & Plan:  Assessment & Plan   Hospital discharge follow-up Recent hospitalization for sepsis with bacteremia due to Enterobacter and E. coli. Completed IV antibiotics, now on oral cephalexin . No fever, stable temperatures. Hospitalization reviewed, medications reviewed. Plan and follow up for acute concerns below. - Continue cephalexin  for three more days as per discharge plan.  Undifferentiated schizophrenia (HCC) Dementia with agitation, unspecified dementia severity, unspecified dementia type (HCC) Resending refills. This is stable  behavioral regimen. -     Brexpiprazole ; Take 1 tablet (0.25 mg total) by mouth daily.  Dispense: 90 tablet; Refill: 3 -     Mirtazapine ; Take 0.5 tablets (7.5 mg total) by mouth at bedtime.  Dispense: 45 tablet; Refill: 3 -     traZODone  HCl; Take 0.5-1 tablets (25-50 mg total) by mouth 2 (two) times daily.  Dispense: 180 tablet; Refill:  3  Hypertension, unspecified type Labile blood pressure, possibly affected by recent sepsis though I suspect elevated readings were due to baseline agitation and intolerance to touch. Concerns about over-treatment and risk of hypotension making her oversedated. - Hold antihypertensive medication for one week to assess energy levels. - Monitor blood pressure once daily unless she appears weak or unwell. - Reassess blood pressure management in one week via video visit.  Follow up plan: Return in about 1 week (around 12/20/2023) for Chronic illness f/u.  Hadassah Letters, MD   This visit was completed via video visit through MyChart due to the restrictions of the COVID-19 pandemic. All issues as above were discussed and addressed. Physical exam was done as above through visual confirmation on video through MyChart. If it was felt that the patient should be evaluated in the office, they were directed there. The patient verbally consented to this visit.  Location of the patient: home Location of the provider: work Those involved with this call:  Provider: Geraldine Kling, MD CMA:  Mancil Seat, CMA Time spent on call:  15 minutes with patient face to face via video conference. More than 50% of this time was spent in counseling and coordination of care. 15 minutes total spent in review of patient's record and preparation of their chart. Total time spent on this encounter: 30 minutes.  Today's visit encompasses complex care management of above conditions as part of ongoing care as primary care doctor and longitudinal relationship.

## 2023-12-13 NOTE — Patient Instructions (Addendum)
 Descontinuen la medicina de la presion hasta el [place]

## 2023-12-18 ENCOUNTER — Telehealth (INDEPENDENT_AMBULATORY_CARE_PROVIDER_SITE_OTHER): Admitting: Pediatrics

## 2023-12-18 ENCOUNTER — Encounter: Payer: Self-pay | Admitting: Pediatrics

## 2023-12-18 VITALS — BP 125/55 | HR 68 | Temp 97.6°F

## 2023-12-18 DIAGNOSIS — I10 Essential (primary) hypertension: Secondary | ICD-10-CM | POA: Diagnosis not present

## 2023-12-18 DIAGNOSIS — R54 Age-related physical debility: Secondary | ICD-10-CM | POA: Diagnosis not present

## 2023-12-18 NOTE — Progress Notes (Unsigned)
 Telehealth Visit  I connected with  [name] on 12/20/23 by a video enabled telemedicine application and verified that I am speaking with the correct person using two identifiers.   I discussed the limitations of evaluation and management by telemedicine. The patient expressed understanding and agreed to proceed.  Subjective:    Patient ID: Diana Acosta, female    DOB: Dec 18, 1928, 89 y.o.   MRN: [id number]  HPI: Diana Acosta is a 89 y.o. female  Chief Complaint  Patient presents with   Hypertension    Discussed the use of AI scribe software for clinical note transcription with the patient, who gave verbal consent to proceed.  History of Present Illness   Diana Acosta is a 89 year old female who presents for follow-up regarding her blood pressure management and overall health status.  She has been experiencing issues with blood pressure management. Her blood pressure was recorded at 119/56 mmHg at 7:30 AM, indicating some improvement. She has not been taking amlodipine  recently as the medication ran out yesterday. Despite this, her caregiver reports that she has been more alert and active, although she still feels weak and is not yet walking independently.  Her caregiver notes that she has been more awake today compared to yesterday, when she slept more. She has been more aware and is able to move from side to side on her own, which is an improvement from before when she required assistance to move.  She is currently taking trazodone , mirtazapine , and Rexulti , which have been delivered by the pharmacy. Her caregiver confirmed that she has sufficient supply of these medications.      Relevant past medical, surgical, family and social history reviewed and updated as indicated. Interim medical history since our last visit reviewed. Allergies and medications reviewed and updated.  ROS per HPI unless specifically indicated above      Objective:    BP (!) 125/55 Comment: patient obtained  Pulse 68   Temp 97.6 F (36.4 C) (Oral)   Wt Readings from Last 3 Encounters:  12/03/23 94 lb 12.8 oz (43 kg)  07/31/23 92 lb (41.7 kg)  07/19/23 84 lb (38.1 kg)     Physical Exam Constitutional:      General: She is not in acute distress.    Appearance: Normal appearance.  Neurological:     General: No focal deficit present.     Mental Status: She is alert. Mental status is at baseline.     LIMITED EXAM GIVEN VIDEO VISIT     Assessment & Plan:  Assessment & Plan   Frailty Assessment & Plan: Improvement in activity and alertness. Increased physical engagement. Suspect will need to time to recove post hospitalization.   - Continue trazodone , mirtazapine , and Rexulti  as prescribed.   Hypertension, unspecified type Assessment & Plan: Blood pressure well-controlled at 119/56 mmHg. Amlodipine  held and has helped with somnolence. - Hold amlodipine .     Follow up plan: 2 weeks  Hadassah Letters, MD   This visit was completed via video visit through MyChart due to the restrictions of the COVID-19 pandemic. All issues as above were discussed and addressed. Physical exam was done as above through visual confirmation on video through MyChart. If it was felt that the patient should be evaluated in the office, they were directed there. The patient verbally consented to this visit."} Location of the patient: home Location of the provider: work Those involved with this call:  Provider: Geraldine Kling, MD CMA: Mancil Seat, CMA Time spent on call: 10 minutes with patient face to face via video conference. More than 50% of this time was spent in counseling and coordination of care. 10 minutes total spent in review of patient's record and preparation of their chart. Total time spent on this encounter: 30 minutes.

## 2023-12-20 ENCOUNTER — Encounter: Payer: Self-pay | Admitting: Pediatrics

## 2023-12-20 NOTE — Assessment & Plan Note (Signed)
 Improvement in activity and alertness. Increased physical engagement. Suspect will need to time to recove post hospitalization.   - Continue trazodone , mirtazapine , and Rexulti  as prescribed.

## 2023-12-20 NOTE — Patient Instructions (Signed)
 Continuar el regimen  No le den amlodipine 

## 2023-12-20 NOTE — Assessment & Plan Note (Signed)
 Blood pressure well-controlled at 119/56 mmHg. Amlodipine  held and has helped with somnolence. - Hold amlodipine .

## 2024-01-01 ENCOUNTER — Encounter: Payer: Self-pay | Admitting: Pediatrics

## 2024-01-01 ENCOUNTER — Telehealth (INDEPENDENT_AMBULATORY_CARE_PROVIDER_SITE_OTHER): Admitting: Pediatrics

## 2024-01-01 VITALS — BP 130/65 | HR 83 | Temp 97.7°F

## 2024-01-01 DIAGNOSIS — F203 Undifferentiated schizophrenia: Secondary | ICD-10-CM | POA: Diagnosis not present

## 2024-01-01 DIAGNOSIS — F03911 Unspecified dementia, unspecified severity, with agitation: Secondary | ICD-10-CM

## 2024-01-01 DIAGNOSIS — E46 Unspecified protein-calorie malnutrition: Secondary | ICD-10-CM

## 2024-01-08 ENCOUNTER — Encounter: Payer: Self-pay | Admitting: Pediatrics

## 2024-01-08 NOTE — Assessment & Plan Note (Signed)
 Maintaining weight. She is more awake and increasing feeding.

## 2024-01-08 NOTE — Assessment & Plan Note (Signed)
 Continue current regimen with w rexulti , mirtazapine , and prn trazadone.

## 2024-01-08 NOTE — Progress Notes (Signed)
 Telehealth Visit  I connected with  Diana Acosta on 01/08/24 by a video enabled telemedicine application and verified that I am speaking with the correct person using two identifiers.   I discussed the limitations of evaluation and management by telemedicine. The patient expressed understanding and agreed to proceed.  Subjective:    Patient ID: Diana Acosta, female    DOB: 1929-07-25, 89 y.o.   MRN: [id number]  HPI: Diana Acosta is a 89 y.o. female  Chief Complaint  Patient presents with   Follow-up    She is improving little by little. Still unable to walk but strength is improving. Appetite is good.     Discussed the use of AI scribe software for clinical note transcription with the patient, who gave verbal consent to proceed.  History of Present Illness   Diana Acosta is a 89 year old female who presents for follow-up regarding her mobility and overall health status.  She has been experiencing difficulties with mobility, requiring assistance to sit up and remain seated independently. There is gradual improvement, as she is now able to sit with some support. Exercises taught by a Medicare nurse and her nephew, including leg exercises and massages, are performed twice daily.  Her appetite remains stable, and she is eating well, similar to her previous habits. She is not currently on antihypertensive medication.  She has been experiencing redness around her eyes. Cold compresses are applied by her niece. Tylenol  is given for discomfort, administered in liquid form due to swallowing difficulties. Her niece is cautious about not exceeding the recommended dosage.  Her niece inquired about melatonin for sleep, noting that she is running low. She has been using a dose of 5 to 10 mg, which has been effective without causing excessive daytime drowsiness.     Relevant past medical, surgical, family and social history reviewed and updated as indicated.  Interim medical history since our last visit reviewed. Allergies and medications reviewed and updated.  ROS per HPI unless specifically indicated above     Objective:     BP 130/65 (BP Location: Left Arm, Patient Position: Sitting, Cuff Size: Normal)   Pulse 83   Temp 97.7 F (36.5 C) (Oral)   Wt Readings from Last 3 Encounters:  12/03/23 94 lb 12.8 oz (43 kg)  07/31/23 92 lb (41.7 kg)  07/19/23 84 lb (38.1 kg)     Physical Exam Constitutional:      General: She is not in acute distress.    Appearance: Normal appearance.  Neurological:     General: No focal deficit present.     Mental Status: She is alert. Mental status is at baseline.      LIMITED EXAM GIVEN VIDEO VISIT     Assessment & Plan:  Assessment & Plan   Protein-calorie malnutrition, unspecified severity (HCC) Assessment & Plan: Maintaining weight. She is more awake and increasing feeding.   Undifferentiated schizophrenia (HCC) Assessment & Plan: Continue current regimen with w rexulti , mirtazapine , and prn trazadone.   Dementia with agitation, unspecified dementia severity, unspecified dementia type (HCC) Assessment & Plan: Melatonin discussed for sleep disturbances. Recommended dose to avoid daytime drowsiness. - Administer melatonin 5 to 10 mg as needed. - Adjust other medications if melatonin is insufficient.      Follow up plan: Return in about 4 weeks (around 01/29/2024).  Hadassah Letters, MD   This visit was completed via video visit through MyChart due to the restrictions  of the COVID-19 pandemic. All issues as above were discussed and addressed. Physical exam was done as above through visual confirmation on video through MyChart. If it was felt that the patient should be evaluated in the office, they were directed there. The patient verbally consented to this visit."} Location of the patient: home Location of the provider: work Those involved with this call:  Provider: Geraldine Kling,  MD CMA: Mancil Seat, CMA Time spent on call: 15 minutes with patient face to face via video conference. More than 50% of this time was spent in counseling and coordination of care. 15 minutes total spent in review of patient's record and preparation of their chart. Total time spent on this encounter: 30 minutes.

## 2024-01-08 NOTE — Assessment & Plan Note (Signed)
 Melatonin discussed for sleep disturbances. Recommended dose to avoid daytime drowsiness. - Administer melatonin 5 to 10 mg as needed. - Adjust other medications if melatonin is insufficient.

## 2024-01-30 ENCOUNTER — Telehealth: Admitting: Pediatrics

## 2024-01-30 DIAGNOSIS — F03911 Unspecified dementia, unspecified severity, with agitation: Secondary | ICD-10-CM

## 2024-01-30 DIAGNOSIS — E46 Unspecified protein-calorie malnutrition: Secondary | ICD-10-CM | POA: Diagnosis not present

## 2024-01-30 DIAGNOSIS — R5381 Other malaise: Secondary | ICD-10-CM

## 2024-01-30 DIAGNOSIS — F203 Undifferentiated schizophrenia: Secondary | ICD-10-CM | POA: Diagnosis not present

## 2024-02-05 ENCOUNTER — Encounter: Payer: Self-pay | Admitting: Pediatrics

## 2024-02-05 NOTE — Assessment & Plan Note (Signed)
 Discussed palliative care involvement for symptom management and quality of life improvement. Family interested. - Initiate referral to palliative care team for symptom management and support.

## 2024-02-05 NOTE — Assessment & Plan Note (Signed)
 Post-hospitalization weight loss and weakness with mobility issues. Discussed home health services for assessment and support. - Refer to home health services for comprehensive assessment, including physical therapy and potential wound care. - Consider home physical therapy to improve mobility.

## 2024-02-05 NOTE — Assessment & Plan Note (Signed)
 Weekly episodes of agitation, hallucinations, and confusion. Current medications effective. Discussed palliative care for symptom management at home. - Continue Rexulti  and trazodone . - Reassess symptoms in one month. - Refer to palliative care team for evaluation and management.

## 2024-02-05 NOTE — Progress Notes (Signed)
 Telehealth Visit  I connected with  Diana Acosta on 02/05/24 by a video enabled telemedicine application and verified that I am speaking with the correct person using two identifiers.   I discussed the limitations of evaluation and management by telemedicine. The patient expressed understanding and agreed to proceed.  Subjective:    Patient ID: Diana Acosta, female    DOB: 18-Jun-1929, 89 y.o.   MRN: [id number]  HPI: Diana Acosta is a 89 y.o. female  Chief Complaint  Patient presents with   Medical Management of Chronic Issues    Discussed the use of AI scribe software for clinical note transcription with the patient, who gave verbal consent to proceed.  History of Present Illness   Diana Acosta is a 89 year old female who presents with symptoms of agitation and confusion. She is accompanied by her niece, Diana Acosta, who is her primary caregiver.  She experiences episodes of increased agitation and confusion, characterized by excessive talking and frightening visions. These episodes occur intermittently, approximately once a week, and sometimes involve her trying to touch or defend herself against perceived threats. Her current medication regimen includes Rexulti  at a dose of 0.5 mg and Trazodone .  She has episodes where she appears to stop breathing momentarily, which has occurred twice in the past week. These episodes cause distress, and her caregiver has been adjusting her head position to alleviate the symptoms. She is currently on Mirtazapine  7.5 mg, which is at the minimum dose.  Her appetite has improved, but she has been losing weight, particularly in her legs, since her recent hospitalization. She is described as being very weak, spending most of her time in bed, and having difficulty getting up to sit in a chair. Her caregivers have noted an increase in accidents in bed, requiring frequent changes of sheets despite the use of diapers.       Relevant past medical, surgical, family and social history reviewed and updated as indicated. Interim medical history since our last visit reviewed. Allergies and medications reviewed and updated.  ROS per HPI unless specifically indicated above     Objective:    There were no vitals taken for this visit.  Wt Readings from Last 3 Encounters:  12/03/23 94 lb 12.8 oz (43 kg)  07/31/23 92 lb (41.7 kg)  07/19/23 84 lb (38.1 kg)     Physical Exam Constitutional:      General: She is not in acute distress.    Appearance: Normal appearance.   Neurological:     General: No focal deficit present.     Mental Status: She is alert. Mental status is at baseline.      LIMITED EXAM GIVEN VIDEO VISIT     Assessment & Plan:  Assessment & Plan   Physical deconditioning Protein-calorie malnutrition, unspecified severity (HCC) Assessment & Plan: Post-hospitalization weight loss and weakness with mobility issues. Discussed home health services for assessment and support. Unable to bring in to office easily so full assessment is limited. - Refer to home health services for comprehensive assessment, including physical therapy and potential wound care. - Consider home physical therapy to improve mobility.  Orders: -     Amb Referral to Palliative Care -     Ambulatory referral to Home Health  Undifferentiated schizophrenia [facility]) Assessment & Plan: Weekly episodes of agitation, hallucinations, and confusion. Current medications effective. Discussed palliative care for symptom management at home. - Continue Rexulti  and trazodone . - Reassess symptoms in  one month. - Refer to palliative care team for evaluation and management.  Orders: -     Amb Referral to Palliative Care -     Ambulatory referral to Home Health  Dementia with agitation, unspecified dementia severity, unspecified dementia type [facility]) Assessment & Plan: Discussed palliative care involvement for symptom management and  quality of life improvement. Family interested. - Initiate referral to palliative care team for symptom management and support.  Orders: -     Amb Referral to Palliative Care -     Ambulatory referral to Home Health    Follow up plan: 4 weeks  Diana SHAUNNA Nett, MD   This visit was completed via video visit through MyChart due to the restrictions of the COVID-19 pandemic. All issues as above were discussed and addressed. Physical exam was done as above through visual confirmation on video through MyChart. If it was felt that the patient should be evaluated in the office, they were directed there. The patient verbally consented to this visit.  Location of the patient: home Location of the provider: work Those involved with this call:  Provider: Hadassah Nett, MD CMA: Clotilda Eagles, CMA Time spent on call: 15 minutes with patient face to face via video conference. More than 50% of this time was spent in counseling and coordination of care. 15 minutes total spent in review of patient's record and preparation of their chart. Total time spent on this encounter: 30 minutes.

## 2024-02-06 ENCOUNTER — Telehealth: Payer: Self-pay | Admitting: Pediatrics

## 2024-02-06 NOTE — Telephone Encounter (Signed)
 Copied from CRM [id number]. Topic: General - Other >> Feb 06, 2024  1:05 PM Zebedee SAUNDERS wrote: Reason for CRM: Received call from [facility] per [facility] Ph: [phone] will be pt's palliative care.

## 2024-02-13 ENCOUNTER — Ambulatory Visit: Admitting: Pediatrics

## 2024-02-19 ENCOUNTER — Encounter: Payer: Self-pay | Admitting: Pediatrics

## 2024-02-19 ENCOUNTER — Telehealth (INDEPENDENT_AMBULATORY_CARE_PROVIDER_SITE_OTHER): Admitting: Pediatrics

## 2024-02-19 VITALS — BP 131/54 | HR 65 | Temp 97.9°F

## 2024-02-19 DIAGNOSIS — Z8673 Personal history of transient ischemic attack (TIA), and cerebral infarction without residual deficits: Secondary | ICD-10-CM | POA: Diagnosis not present

## 2024-02-19 DIAGNOSIS — E46 Unspecified protein-calorie malnutrition: Secondary | ICD-10-CM

## 2024-02-19 DIAGNOSIS — F03918 Unspecified dementia, unspecified severity, with other behavioral disturbance: Secondary | ICD-10-CM | POA: Diagnosis not present

## 2024-02-19 DIAGNOSIS — F03911 Unspecified dementia, unspecified severity, with agitation: Secondary | ICD-10-CM

## 2024-02-19 DIAGNOSIS — R5381 Other malaise: Secondary | ICD-10-CM

## 2024-02-19 DIAGNOSIS — F203 Undifferentiated schizophrenia: Secondary | ICD-10-CM

## 2024-02-19 DIAGNOSIS — Z7409 Other reduced mobility: Secondary | ICD-10-CM

## 2024-02-19 MED ORDER — MIRTAZAPINE 15 MG PO TBDP: 7.5000 mg | ORAL_TABLET | Freq: Every day | ORAL | 3 refills | Status: DC

## 2024-02-19 NOTE — Patient Instructions (Addendum)
 Please call [phone] to make an appointment.

## 2024-02-23 ENCOUNTER — Encounter: Payer: Self-pay | Admitting: Pediatrics

## 2024-02-23 DIAGNOSIS — Z7409 Other reduced mobility: Secondary | ICD-10-CM | POA: Insufficient documentation

## 2024-02-23 NOTE — Progress Notes (Signed)
 Telehealth Visit  I connected with  [name] on 02/23/24 by a video enabled telemedicine application and verified that I am speaking with the correct person using two identifiers.   I discussed the limitations of evaluation and management by telemedicine. The patient expressed understanding and agreed to proceed.  Subjective:    Patient ID: Diana Acosta, female    DOB: 05-01-1929, 89 y.o.   MRN: [id number]  HPI: Diana Acosta is a 89 y.o. female  Chief Complaint  Patient presents with   Dementia    Didn't sleep well last night    Discussed the use of AI scribe software for clinical note transcription with the patient, who gave verbal consent to proceed.  History of Present Illness   Diana Acosta is a 89 year old female who presents with sleep disturbances and pain management issues.  She experiences sleep disturbances characterized by frequent awakenings during the night, despite taking trazodone . Her caregiver administers trazodone , sometimes up to two and a half tablets, but she does not sleep through the night due to dreams and thoughts about daily activities.  She has pain in her hip and wrist, which appears to be influenced by weather conditions. Her caregiver administers Tylenol  for pain management, which results in somnolence. Recently, she has been waking up with hip pain.  She is currently taking mirtazapine , with her caregiver administering half of a 15 mg tablet. She has 19 tablets left from a previous prescription. She is also taking Rexulti , with a supply expected to last until Friday. Her caregiver has contacted the pharmacy to ensure continued delivery of her medications.  Her caregiver reports that she can sit upright without needing pillows for support, but she remains hesitant to walk and has not regained full confidence in standing or walking.  Her caregiver mentioned that the family is dealing with multiple health  issues, including a relative who underwent open-heart surgery, contributing to a stressful environment.         Relevant past medical, surgical, family and social history reviewed and updated as indicated. Interim medical history since our last visit reviewed. Allergies and medications reviewed and updated.  ROS per HPI unless specifically indicated above     Objective:    BP (!) 131/54 Comment: Patient obtained  Pulse 65   Temp 97.9 F (36.6 C)   LMP  (LMP Unknown)   Wt Readings from Last 3 Encounters:  12/03/23 94 lb 12.8 oz (43 kg)  07/31/23 92 lb (41.7 kg)  07/19/23 84 lb (38.1 kg)     Physical Exam Constitutional:      General: She is not in acute distress.    Appearance: Normal appearance.  Neurological:     General: No focal deficit present.     Mental Status: She is alert. Mental status is at baseline.      LIMITED EXAM GIVEN VIDEO VISIT     Assessment & Plan:  Assessment & Plan   Dementia with agitation, unspecified dementia severity, unspecified dementia type (HCC) Assessment & Plan: Intermittent nocturnal awakenings with dreams and task-related thoughts. Current regimen includes trazodone  and mirtazapine . Concerns about increasing mirtazapine  due to previous adverse effects. Discussed dosage adjustments but preferred current regimen. - Continue trazodone , rexulti  and mirtazapine   - Consider increasing mirtazapine  to 15 mg for one or two nights if needed.  Orders: -     Mirtazapine ; Take 0.5 tablets (7.5 mg total) by mouth at bedtime.  Dispense: 45  tablet; Refill: 3  History of CVA Protein-calorie malnutrition, unspecified severity (HCC) Physical deconditioning Poor mobility Assessment & Plan: I remain concerned about her malnutrition, physical deconditioning particularly post hospitalization. I will replace home health referral. Palliative care referral placed previously but has not yet heard from them.   Follow up plan: No follow-ups on  file.  Hadassah SHAUNNA Nett, MD   This visit was completed via video visit through MyChart due to the restrictions of the COVID-19 pandemic. All issues as above were discussed and addressed. Physical exam was done as above through visual confirmation on video through MyChart. If it was felt that the patient should be evaluated in the office, they were directed there. The patient verbally consented to this visit. Location of the patient: home Location of the provider: work Those involved with this call:  Provider: Hadassah Nett, MD CMA: Cena Maffucci, CMA Time spent on call: 15 minutes with patient face to face via video conference. More than 50% of this time was spent in counseling and coordination of care. 15 minutes total spent in review of patient's record and preparation of their chart. Total time spent on this encounter: 30 minutes.

## 2024-02-23 NOTE — Assessment & Plan Note (Signed)
 Intermittent nocturnal awakenings with dreams and task-related thoughts. Current regimen includes trazodone  and mirtazapine . Concerns about increasing mirtazapine  due to previous adverse effects. Discussed dosage adjustments but preferred current regimen. - Continue trazodone , rexulti  and mirtazapine   - Consider increasing mirtazapine  to 15 mg for one or two nights if needed.

## 2024-02-23 NOTE — Assessment & Plan Note (Addendum)
 I remain concerned about her malnutrition, physical deconditioning particularly post hospitalization. I will replace home health referral. Palliative care referral placed previously but has not yet heard from them.

## 2024-03-26 ENCOUNTER — Other Ambulatory Visit: Payer: Self-pay | Admitting: Pediatrics

## 2024-03-26 DIAGNOSIS — F03911 Unspecified dementia, unspecified severity, with agitation: Secondary | ICD-10-CM

## 2024-03-26 NOTE — Telephone Encounter (Signed)
 Copied from CRM [id number]. Topic: Clinical - Medication Refill >> Mar 26, 2024  9:56 AM Travis F wrote: Medication: mirtazapine  (REMERON  SOL-TAB) 15 MG disintegrating tablet [[id number]]  Has the patient contacted their pharmacy? Yes  (Agent: If yes, when and what did the pharmacy advise?) Contact office   This is the patient's preferred pharmacy:   [id number] - [place] ([address]), [place] - [address]  Is this the correct pharmacy for this prescription? Yes If no, delete pharmacy and type the correct one.   Has the prescription been filled recently? Yes  Is the patient out of the medication? No, patient has one pill left   Has the patient been seen for an appointment in the last year OR does the patient have an upcoming appointment? Yes  Can we respond through MyChart? No  Agent: Please be advised that Rx refills may take up to 3 business days. We ask that you follow-up with your pharmacy.

## 2024-03-28 MED ORDER — MIRTAZAPINE 15 MG PO TBDP: 7.5000 mg | ORAL_TABLET | Freq: Every day | ORAL | 3 refills | Status: DC

## 2024-03-28 NOTE — Telephone Encounter (Signed)
 Medication Rx last refilled from [facility] and sent to patient 02/20/24 for 60 day supply. Deward, pharmacist reports order is for patient to take 0.5 tablets at hs and each tablet if disintegrating and is not recommended to split medication per manufacturer.

## 2024-03-28 NOTE — Telephone Encounter (Signed)
 Requested medication (s) are due for refill today: no  Requested medication (s) are on the active medication list: yes   Last refill:  02/19/24-02/18/25 #45 3 refills  Future visit scheduled: no  Notes to clinic:   Last refill from [facility] dispensed 02/20/24 for 60 day supply. Too soon for refill. Spoke with pharmacist Deward , manufacturer not recommended to split tablets. Patient requesting refill at a different pharmacy . Please advise .     Requested Prescriptions  Pending Prescriptions Disp Refills   mirtazapine  (REMERON  SOL-TAB) 15 MG disintegrating tablet 45 tablet 3    Sig: Take 0.5 tablets (7.5 mg total) by mouth at bedtime.     Psychiatry: Antidepressants - mirtazapine  Passed - 03/28/2024  2:43 PM      Passed - Valid encounter within last 6 months    Recent Outpatient Visits           1 month ago Dementia with agitation, unspecified dementia severity, unspecified dementia type [facility])   [place] [facility] Hadassah SQUIBB, MD   1 month ago Physical deconditioning   [place] [facility] Hadassah SQUIBB, MD   2 months ago Protein-calorie malnutrition, unspecified severity [facility])   [place] [facility] Herold Hadassah SQUIBB, MD   3 months ago Frailty    [facility] Hadassah SQUIBB, MD   3 months ago Hospital discharge follow-up    [facility] Hadassah SQUIBB, MD

## 2024-03-28 NOTE — Telephone Encounter (Signed)
 Patient requesting to have prescription sent to [facility] instead of mail order.

## 2024-04-02 ENCOUNTER — Ambulatory Visit: Admitting: Pediatrics

## 2024-04-03 DIAGNOSIS — F03911 Unspecified dementia, unspecified severity, with agitation: Secondary | ICD-10-CM | POA: Diagnosis not present

## 2024-04-03 DIAGNOSIS — Z5982 Transportation insecurity: Secondary | ICD-10-CM

## 2024-04-03 DIAGNOSIS — F03918 Unspecified dementia, unspecified severity, with other behavioral disturbance: Secondary | ICD-10-CM | POA: Diagnosis not present

## 2024-04-03 DIAGNOSIS — E45 Retarded development following protein-calorie malnutrition: Secondary | ICD-10-CM | POA: Diagnosis not present

## 2024-04-03 DIAGNOSIS — Z556 Problems related to health literacy: Secondary | ICD-10-CM

## 2024-04-03 DIAGNOSIS — Z681 Body mass index (BMI) 19 or less, adult: Secondary | ICD-10-CM

## 2024-04-03 DIAGNOSIS — Z8673 Personal history of transient ischemic attack (TIA), and cerebral infarction without residual deficits: Secondary | ICD-10-CM

## 2024-04-03 DIAGNOSIS — Z7401 Bed confinement status: Secondary | ICD-10-CM

## 2024-04-03 DIAGNOSIS — G479 Sleep disorder, unspecified: Secondary | ICD-10-CM | POA: Diagnosis not present

## 2024-04-04 ENCOUNTER — Other Ambulatory Visit: Payer: Self-pay | Admitting: Pediatrics

## 2024-04-04 DIAGNOSIS — F03911 Unspecified dementia, unspecified severity, with agitation: Secondary | ICD-10-CM

## 2024-04-04 MED ORDER — MIRTAZAPINE 15 MG PO TBDP: 15.0000 mg | ORAL_TABLET | Freq: Every day | ORAL | 3 refills | Status: AC

## 2024-04-04 NOTE — Progress Notes (Signed)
 Sent remeron  15mg  refills to mail order pharmacy given has responded well to higher dose.  Hadassah SHAUNNA Nett, MD

## 2024-04-08 ENCOUNTER — Ambulatory Visit: Payer: Self-pay

## 2024-04-08 NOTE — Telephone Encounter (Signed)
 FYI Only or Action Required?: Action required by provider: would like orders and labs, ua and c&s.  Patient was last seen in primary care on 02/19/2024 by Herold Hadassah SQUIBB, MD.  Called Nurse Triage reporting low blood pressure.  Symptoms began today.  Interventions attempted: Nothing.  Symptoms are: stable.  Triage Disposition: See HCP Within 4 Hours (Or PCP Triage)  Patient/caregiver understands and will follow disposition?: Yes  Denies dizziness, light headedness, nausea.  Was instructed to go to ER if becomes worse.  Copied from CRM #[id number]. Topic: Clinical - Red Word Triage >> Apr 08, 2024  1:05 PM Yolanda T wrote: [facility] that prompted transfer to Nurse Triage: Therisa from [facility] stated patients blood pressure is 89/49. She retook it and it was 98/55. She is not showing unusual symptoms. Reason for Disposition  [1] Systolic BP < 90 AND [2] NOT feeling weak or lightheaded  Answer Assessment - Initial Assessment Questions 1. BLOOD PRESSURE: What is your blood pressure? Did you take at least two measurements 5 minutes apart?     89/49 and 98/55 2. ONSET: When did you take your blood pressure?     Today with home health nurse 3. HOW: How did you take your blood pressure? (e.g., visiting nurse, automatic home BP monitor)     Manually  4. HISTORY: Do you have a history of low blood pressure? What is your blood pressure normally?     HTN hx 5. MEDICINES: Are you taking any medicines for blood pressure? If Yes, ask: Have they been changed recently?     no 6. PULSE RATE: Do you know what your pulse rate is?      denies 7. OTHER SYMPTOMS: Have you been sick recently? Have you had a recent injury?     no 8. PREGNANCY: Is there any chance you are pregnant? When was your last menstrual period?     na  Protocols used: Blood Pressure - Low-A-AH

## 2024-04-08 NOTE — Telephone Encounter (Signed)
 Routing to provider to advise.

## 2024-04-10 NOTE — Telephone Encounter (Signed)
 Attempted to reach patient no answer will attempt at a later time.   Okay for E2C2 to speak with patient

## 2024-04-15 NOTE — Telephone Encounter (Signed)
 Second attempt to contact patient

## 2024-04-18 NOTE — Telephone Encounter (Signed)
 Unable to reach patient.

## 2024-04-21 ENCOUNTER — Telehealth: Payer: Self-pay

## 2024-04-21 ENCOUNTER — Telehealth: Payer: Self-pay | Admitting: Pediatrics

## 2024-04-21 NOTE — Telephone Encounter (Signed)
 Copied from CRM [id number]. Topic: Clinical - Medication Question >> Apr 21, 2024  1:58 PM Donee H wrote: Reason for CRM: Kirke DEL. from [facility] called to get clarification on a previous refill request for traZODone  (DESYREL ) 50 MG tablet. There is a concern with drug interaction for traZODone  (DESYREL ) 50 MG tablet  with other medication patient is taking mirtazapine  (REMERON  SOL-TAB) 15 MG disintegrating tablet. She is wanting to know if it is ok for patient to take trazodone  with medication. Please follow up on question so they will know how to precede.  Doctor line [phone] Reference number [id number]

## 2024-04-21 NOTE — Telephone Encounter (Signed)
 Attempted to reach Diana [facility] to see which labs she is requesting number is not in service will try again next business day

## 2024-04-21 NOTE — Telephone Encounter (Signed)
 Copied from CRM [id number]. Topic: Clinical - Request for Lab/Test Order >> Apr 17, 2024 11:57 AM Larissa RAMAN wrote: Reason for CRM: Ouita with [facility], requesting lab orders for patient. Requesting a callback  Callback # [phone]

## 2024-04-21 NOTE — Telephone Encounter (Signed)
 Routing to provider to advise.

## 2024-04-22 ENCOUNTER — Telehealth: Payer: Self-pay

## 2024-04-22 NOTE — Telephone Encounter (Signed)
 Copied from CRM [id number]. Topic: Clinical - Prescription Issue >> Apr 22, 2024 11:46 AM Delon DASEN wrote: Reason for CRM: traZODone  (DESYREL ) 50 MG tablet and mirtazapine  (REMERON  SOL-TAB) 15 MG disintegrating tablet - Garrel with [facility] calling about drug interaction-  [phone]

## 2024-04-23 NOTE — Telephone Encounter (Signed)
 Copied from CRM #[id number]. Topic: Clinical - Medication Question >> Apr 23, 2024 11:29 AM Anairis L wrote: Reason for CRM: Leita from [facility] following up on medication interaction between trazodone  and remeron .   [phone] Mzq#[id number]

## 2024-04-30 ENCOUNTER — Telehealth: Payer: Self-pay

## 2024-04-30 NOTE — Telephone Encounter (Signed)
 Will close this encounter.

## 2024-04-30 NOTE — Telephone Encounter (Unsigned)
 Copied from CRM #[id number]. Topic: Clinical - Medication Prior Auth >> Apr 30, 2024 10:56 AM Debby BROCKS wrote: Reason for CRM: The patients sister called in to state that [facility] health has sent an authorization to Dr. Herold to see if they can take a blood sample from her as requested. They are still waiting for the reply/authorization and cannot continue until its received

## 2024-04-30 NOTE — Telephone Encounter (Signed)
 Copied from CRM [id number]. Topic: Clinical - Home Health Verbal Orders >> Apr 30, 2024  3:20 PM Chiquita SQUIBB wrote: Caller/Agency: Lillyanne from [facility]  Callback Number: [phone] Service Requested: Social Worker for advance directive  Frequency: Nitzia is unsure at this time  Any new concerns about the patient? No

## 2024-04-30 NOTE — Telephone Encounter (Signed)
 Copied from CRM [id number]. Topic: Clinical - Request for Lab/Test Order >> Apr 17, 2024 11:57 AM Larissa RAMAN wrote: Reason for CRM: Marvena with [facility], requesting lab orders for patient. Requesting a callback  Callback # [phone] >> Apr 30, 2024  3:19 PM Chiquita SQUIBB wrote: Therisa is calling back in regarding the lab work requested. She stated the patients family is requesting a urine sample, stool sample, and a full blood work panel. She wanted to note the patient is bed ridden and that is why they are doing the testing. A good call back number for [facility] [phone].

## 2024-05-01 NOTE — Telephone Encounter (Signed)
 Spoke with Diana Acosta again. She will place orders for CMP, BMP, UA and stool. Asked that provider reach back out if there is any more that should be done prior to noon 05/02/2024.

## 2024-05-01 NOTE — Telephone Encounter (Signed)
 The UA and stool is by request of patients sister only.

## 2024-05-01 NOTE — Telephone Encounter (Signed)
 Authorization given to [facility].

## 2024-05-02 NOTE — Telephone Encounter (Signed)
 Done

## 2024-05-02 NOTE — Telephone Encounter (Signed)
 Called and notified pharmacy of Dr. Lenard response.

## 2024-05-08 ENCOUNTER — Telehealth: Payer: Self-pay

## 2024-05-08 NOTE — Telephone Encounter (Signed)
 Copied from CRM [id number]. Topic: Clinical - Medical Advice >> May 08, 2024 10:09 AM Emylou G wrote: Reason for CRM: Princessa with [facility] called said Lab place lost urine - and she could barely get blood from her ( not sure if she is dehydrated ) .SABRA Idaliz wants to know what type of stool are we needing per the Dr request?  Is there a need?  Wanted clarity on the type of stool? Callback # [phone] - there are different containers for different types of stool and parameters - hoping to get a call back by one

## 2024-05-12 NOTE — Telephone Encounter (Signed)
 Attempted to reach pt unsuccessful left vm for a return call

## 2024-05-14 ENCOUNTER — Telehealth: Payer: Self-pay

## 2024-05-14 NOTE — Telephone Encounter (Signed)
 Copied from CRM [id number]. Topic: General - Other >> May 14, 2024 10:25 AM Larissa RAMAN wrote: Reason for CRM: Patient's sister, Ted Overland, requesting certification/documentation stating that patient is not able to sign documents. She states she needs this documentation for social services. Requesting a callback.

## 2024-05-16 NOTE — Telephone Encounter (Signed)
 Unsuccessful with getting in touch with patient

## 2024-05-19 ENCOUNTER — Telehealth: Payer: Self-pay

## 2024-05-19 NOTE — Telephone Encounter (Signed)
 Verbal ordered received from PCP and provided to Richard to start care for patient. They plan to see patient this afternoon.

## 2024-05-19 NOTE — Telephone Encounter (Signed)
 Copied from CRM [id number]. Topic: General - Other >> May 14, 2024 10:25 AM Larissa RAMAN wrote: Reason for CRM: Patient's sister, Ted Overland, requesting certification/documentation stating that patient is not able to sign documents. She states she needs this documentation for social services. Requesting a callback. >> May 19, 2024  2:28 PM Darshell M wrote: Ted Overland (patient sister) needs a letter showing patient is unable  communicate for herself to complete form for power of attorney. Ted wants to come by the office to pick up the later. Ted CB# [phone]

## 2024-05-19 NOTE — Telephone Encounter (Signed)
 Copied from CRM #[id number]. Topic: Clinical - Home Health Verbal Orders >> May 19, 2024 11:28 AM Winona SAUNDERS wrote: Randa  from [facility] hospice calling requesting verbal order for Hospice care. Call back- [phone]- secure line may leave a voicemail.

## 2024-05-19 NOTE — Telephone Encounter (Signed)
 After speaking with patients sister the additional information I found needing to be relayed. Her sister has attempted recently to complete some things on her behalf but since she does not currently have guardianship or hold her sisters medical power of attorney she is being blocked on making decisions on Georjean's behalf. Her sister needs a letter stating that per providers PCP patient is medically incapable of managing herself and attending to any of her personal affairs without the assistance of her sister. The letter must state that patient is an incompetent adult and needs someone to manage all her health and financial affairs from this day forward.   Sister is aware once provider is back in the office tomorrow we will assist in getting the letter generated so she can come to the office to pick up.

## 2024-05-19 NOTE — Telephone Encounter (Signed)
 Notes from May have been faxed per request.

## 2024-05-19 NOTE — Telephone Encounter (Signed)
 Copied from CRM #[id number]. Topic: General - Other >> May 19, 2024  1:35 PM Shanda MATSU wrote: Reason for CRM: [facility] CB # [phone] called in req for any progress notes that we may have that document patient's Alzheimer's disease be faxed to [phone] so that provider can provide CTI for patient.

## 2024-05-23 ENCOUNTER — Telehealth: Payer: Self-pay | Admitting: Pediatrics

## 2024-05-23 NOTE — Telephone Encounter (Signed)
 Copied from CRM [id number]. Topic: General - Other >> May 23, 2024  1:23 PM Zebedee SAUNDERS wrote: Reason for CRM: Pt's sister Emelly, [place] [phone] is requesting letter sent to [facility] for POA at social security. She would like a call back.

## 2024-05-27 ENCOUNTER — Encounter: Payer: Self-pay | Admitting: Pediatrics

## 2024-05-27 ENCOUNTER — Ambulatory Visit: Payer: Self-pay | Admitting: Pediatrics

## 2024-06-06 NOTE — Telephone Encounter (Signed)
 done

## 2024-06-30 ENCOUNTER — Emergency Department

## 2024-06-30 ENCOUNTER — Encounter: Payer: Self-pay | Admitting: *Deleted

## 2024-06-30 ENCOUNTER — Other Ambulatory Visit: Payer: Self-pay

## 2024-06-30 ENCOUNTER — Emergency Department
Admission: EM | Admit: 2024-06-30 | Discharge: 2024-07-01 | Disposition: A | Discharge status: Home / Self Care | Attending: Emergency Medicine | Admitting: Emergency Medicine

## 2024-06-30 DIAGNOSIS — Z8673 Personal history of transient ischemic attack (TIA), and cerebral infarction without residual deficits: Secondary | ICD-10-CM | POA: Diagnosis not present

## 2024-06-30 DIAGNOSIS — R251 Tremor, unspecified: Secondary | ICD-10-CM | POA: Insufficient documentation

## 2024-06-30 DIAGNOSIS — I10 Essential (primary) hypertension: Secondary | ICD-10-CM | POA: Diagnosis not present

## 2024-06-30 DIAGNOSIS — F0390 Unspecified dementia without behavioral disturbance: Secondary | ICD-10-CM | POA: Diagnosis not present

## 2024-06-30 LAB — BASIC METABOLIC PANEL WITH GFR
Anion gap: 18 — ABNORMAL HIGH (ref 5–15)
BUN: 30 mg/dL — ABNORMAL HIGH (ref 8–23)
CO2: 17 mmol/L — ABNORMAL LOW (ref 22–32)
Calcium: 9.1 mg/dL (ref 8.9–10.3)
Chloride: 99 mmol/L (ref 98–111)
Creatinine, Ser: 0.82 mg/dL (ref 0.44–1.00)
GFR, Estimated: >60 mL/min (ref >60)
Glucose, Bld: 116 mg/dL — ABNORMAL HIGH (ref 70–99)
Potassium: 4 mmol/L (ref 3.5–5.1)
Sodium: 134 mmol/L — ABNORMAL LOW (ref 135–145)

## 2024-06-30 LAB — CBC
HCT: 36 % (ref 36.0–46.0)
Hemoglobin: 11.8 g/dL — ABNORMAL LOW (ref 12.0–15.0)
MCH: 30.6 pg (ref 26.0–34.0)
MCHC: 32.8 g/dL (ref 30.0–36.0)
MCV: 93.3 fL (ref 80.0–100.0)
Platelets: 157 K/uL (ref 150–400)
RBC: 3.86 MIL/uL — ABNORMAL LOW (ref 3.87–5.11)
RDW: 12.9 % (ref 11.5–15.5)
WBC: 6.2 K/uL (ref 4.0–10.5)
nRBC: 0 % (ref 0.0–0.2)

## 2024-06-30 NOTE — ED Notes (Signed)
 Patient transported to CT

## 2024-06-30 NOTE — ED Notes (Signed)
Ua to lab

## 2024-06-30 NOTE — ED Provider Notes (Signed)
 [facility] Provider Note   Event Date/Time   First MD Initiated Contact with Patient 06/30/24 2208     (approximate) History  Tremors  HPI Chiquetta Riyan Gavina is a 89 y.o. female with a past medical history of schizophrenia, dementia, hypertension, and previous stroke who presents via EMS from home after patient's sister states that she noticed tremors and shaking.  She describes upper extremity shaking that lasted approximately 5 minutes before resolving spontaneously.  Sister states that patient has had episodes similar to this in the past however they have been sustained and were associated with an infection.  Patient's sister states that she brought her in due to trying to catch an infection early.  Patient has not had any further episodes of tremors or shaking ROS: Able to assess   Physical Exam  Triage Vital Signs: ED Triage Vitals  Encounter Vitals Group     BP 06/30/24 2028 (!) 153/94     Girls Systolic BP Percentile --      Girls Diastolic BP Percentile --      Boys Systolic BP Percentile --      Boys Diastolic BP Percentile --      Pulse Rate 06/30/24 2028 98     Resp 06/30/24 2028 20     Temp 06/30/24 2028 97.7 F (36.5 C)     Temp Source 06/30/24 2028 Axillary     SpO2 06/30/24 2028 95 %     Weight 06/30/24 2029 94 lb 12.8 oz (43 kg)     Height 06/30/24 2029 5' (1.524 m)     Head Circumference --      Peak Flow --      Pain Score 06/30/24 2029 0     Pain Loc --      Pain Education --      Exclude from Growth Chart --    Most recent vital signs: Vitals:   06/30/24 2208 06/30/24 2209  BP:    Pulse: 66 66  Resp:    Temp:    SpO2: 96% 97%   General: Awake, cooperative CV:  Good peripheral perfusion. Resp:  Normal effort. Abd:  No distention. Other:  Elderly well-developed, well-nourished Hispanic female resting comfortably in no acute distress.  Constant writhing movements and repetitive mouth movements ED Results / Procedures  / Treatments  Labs (all labs ordered are listed, but only abnormal results are displayed) Labs Reviewed  BASIC METABOLIC PANEL WITH GFR - Abnormal; Notable for the following components:      Result Value   Sodium 134 (*)    CO2 17 (*)    Glucose, Bld 116 (*)    BUN 30 (*)    Anion gap 18 (*)    All other components within normal limits  CBC - Abnormal; Notable for the following components:   RBC 3.86 (*)    Hemoglobin 11.8 (*)    All other components within normal limits  URINALYSIS, ROUTINE W REFLEX MICROSCOPIC   EKG ED ECG REPORT I, Artist MARLA Kerns, the attending physician, personally viewed and interpreted this ECG. Date: 06/30/2024 EKG Time: 2103 Rate: 76 Rhythm: normal sinus rhythm QRS Axis: normal Intervals: normal ST/T Wave abnormalities: normal Narrative Interpretation: no evidence of acute ischemia RADIOLOGY ED MD interpretation: Pending - All radiology independently interpreted and agree with radiology assessment Official radiology report(s): No results found. PROCEDURES: Critical Care performed: No Procedures MEDICATIONS ORDERED IN ED: Medications - No data to display IMPRESSION / MDM /  ASSESSMENT AND PLAN / ED COURSE  I reviewed the triage vital signs and the nursing notes.                             The patient is on the cardiac monitor to evaluate for evidence of arrhythmia and/or significant heart rate changes. Patient's presentation is most consistent with acute presentation with potential threat to life or bodily function. Patient is a 89 year old female with the above-stated past medical history who presents with her caregiver with complaints of tremors that occurred once prior to arrival and resolved spontaneously DDx: UTI, fever, sepsis, seizure Plan: CBC, CMP, UA, head CT, EKG  Care of this patient will be signed out to the oncoming physician at the end of my shift.  All pertinent patient information conveyed and all questions answered.  All  further care and disposition decisions will be made by the oncoming physician. Clinical Course as of 06/30/24 2317  Mon Jun 30, 2024  2303 S/o from Dr. Jossie: 44f p/w episode of shaking tremors, has happened previously though did not resolve spontaneously in the past (it did today), had UTI last time, wanted to check for that today - urine pending, CT head read pending  If negative workup, anticipate discharge home [MM]    Clinical Course User Index [MM] Clarine Ozell LABOR, MD   FINAL CLINICAL IMPRESSION(S) / ED DIAGNOSES   Final diagnoses:  None   Rx / DC Orders   ED Discharge Orders     None      Note:  This document was prepared using Dragon voice recognition software and may include unintentional dictation errors.   Gevorg Brum K, MD 06/30/24 [phone]

## 2024-06-30 NOTE — ED Triage Notes (Signed)
 Pt brought in via ems from home.  Sister states pt with tremors and shaking today.  No fever.  No n/v/d   Non ambulatory pt.  Pt moving about on stretcher.  No v/d.

## 2024-06-30 NOTE — ED Notes (Signed)
 EDP at bedside

## 2024-07-01 LAB — URINALYSIS, ROUTINE W REFLEX MICROSCOPIC
Bilirubin Urine: NEGATIVE
Glucose, UA: NEGATIVE mg/dL
Hgb urine dipstick: NEGATIVE
Ketones, ur: NEGATIVE mg/dL
Leukocytes,Ua: NEGATIVE
Nitrite: NEGATIVE
Protein, ur: NEGATIVE mg/dL
Specific Gravity, Urine: 1.013 (ref 1.005–1.030)
pH: 6 (ref 5.0–8.0)

## 2024-07-01 NOTE — Discharge Instructions (Signed)
 Your evaluation in the emergency department was reassuring and we saw no concerning findings today.  I cannot entirely rule out the possibility of her having had a seizure, and I placed a referral for you to follow-up with a neurologist in the outpatient setting--they will contact you to schedule an appointment.  Please do follow-up with her primary care provider and hospice provider as well, and return to the emergency department with any recurrent, new or worsening symptoms.

## 2024-07-01 NOTE — ED Provider Notes (Signed)
  Physical Exam  BP (!) 151/82   Pulse 66   Temp 97.7 F (36.5 C) (Axillary)   Resp 19   Ht 5' (1.524 m)   Wt 43 kg   LMP  (LMP Unknown)   SpO2 97%   BMI 18.51 kg/m   Physical Exam  Procedures  Procedures  ED Course / MDM   Clinical Course as of 07/01/24 0118  Mon Jun 30, 2024  2303 S/o from Dr. Jossie: 29f p/w episode of shaking tremors, has happened previously though did not resolve spontaneously in the past (it did today), had UTI last time, wanted to check for that today - urine pending, CT head read pending  If negative workup, anticipate discharge home [MM]  2346 CTH: IMPRESSION: 1. No acute intracranial abnormality. Please note limitation due to motion degraded exam.   [MM]  Tue Jul 01, 2024  0026 UA w/ no e/o infxn [MM]  0110 Patient reevaluated, family updated  Reiterated that she had an episode of acute on chronic tremor that lasted about a minute followed by some drooling.  No known history of seizures.  States the drooling was abnormal for her.  Confirms she is back to her baseline mental state.  Has otherwise been in her usual state of health.  This followed closely by her primary care provider and is on a hospice program  Reassured by negative workup today.  Regarding possible seizure episode, I did discuss possible outpatient follow-up with neurology and/or PMD regarding this or further evaluation inpatient.  Family confirms goals of care with support outpatient management.  Will place referral for neurology and plan for PMD follow-up in addition.  ED return precautions in place.  Family agrees with plan. [MM]    Clinical Course User Index [MM] Clarine Ozell LABOR, MD   Medical Decision Making Amount and/or Complexity of Data Reviewed Labs: ordered. Radiology: ordered.          Clarine Ozell LABOR, MD 07/01/24 [phone]

## 2024-07-16 ENCOUNTER — Other Ambulatory Visit: Payer: Self-pay | Admitting: Pediatrics

## 2024-07-16 DIAGNOSIS — F03911 Unspecified dementia, unspecified severity, with agitation: Secondary | ICD-10-CM

## 2024-07-16 NOTE — Progress Notes (Signed)
 Placed referral for neurology in case can be expedited with new referral.  Hadassah SHAUNNA Nett, MD
